# Patient Record
Sex: Female | Born: 1975 | Race: Black or African American | Hispanic: No | Marital: Married | Smoking: Current every day smoker
Health system: Southern US, Community
[De-identification: ages and names within clinical notes are randomized; demographics above are authoritative.]

## PROBLEM LIST (undated history)

## (undated) DIAGNOSIS — N83209 Unspecified ovarian cyst, unspecified side: Secondary | ICD-10-CM

## (undated) DIAGNOSIS — G039 Meningitis, unspecified: Secondary | ICD-10-CM

## (undated) DIAGNOSIS — G43909 Migraine, unspecified, not intractable, without status migrainosus: Secondary | ICD-10-CM

## (undated) HISTORY — PX: ABLATION: SHX5711

## (undated) HISTORY — PX: TUBAL LIGATION: SHX77

---

## 2014-09-06 ENCOUNTER — Encounter (HOSPITAL_COMMUNITY): Payer: Self-pay | Admitting: Emergency Medicine

## 2014-09-06 ENCOUNTER — Emergency Department (HOSPITAL_COMMUNITY)
Admission: EM | Admit: 2014-09-06 | Discharge: 2014-09-07 | Disposition: A | Discharge status: Home / Self Care | Payer: Medicaid - Out of State | Attending: Emergency Medicine | Admitting: Emergency Medicine

## 2014-09-06 DIAGNOSIS — G43909 Migraine, unspecified, not intractable, without status migrainosus: Secondary | ICD-10-CM | POA: Insufficient documentation

## 2014-09-06 DIAGNOSIS — Z8661 Personal history of infections of the central nervous system: Secondary | ICD-10-CM | POA: Insufficient documentation

## 2014-09-06 DIAGNOSIS — R42 Dizziness and giddiness: Secondary | ICD-10-CM | POA: Insufficient documentation

## 2014-09-06 DIAGNOSIS — M542 Cervicalgia: Secondary | ICD-10-CM | POA: Insufficient documentation

## 2014-09-06 DIAGNOSIS — G971 Other reaction to spinal and lumbar puncture: Secondary | ICD-10-CM | POA: Insufficient documentation

## 2014-09-06 DIAGNOSIS — Z720 Tobacco use: Secondary | ICD-10-CM | POA: Insufficient documentation

## 2014-09-06 DIAGNOSIS — G43009 Migraine without aura, not intractable, without status migrainosus: Secondary | ICD-10-CM

## 2014-09-06 HISTORY — DX: Migraine, unspecified, not intractable, without status migrainosus: G43.909

## 2014-09-06 HISTORY — DX: Meningitis, unspecified: G03.9

## 2014-09-06 NOTE — ED Notes (Signed)
Patient complaining of migraine. Reports has had a headache since Thursday. 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 Recently seen at [facility] for same, and she reports they performed a spinal tap on her at approximately 0530 this morning.

## 2014-09-07 MED ORDER — MAGNESIUM SULFATE 2 GM/50ML IV SOLN
2.0000 g | Freq: Once | INTRAVENOUS | Status: AC
  Administered 2014-09-07: 2 g via INTRAVENOUS
  Filled 2014-09-07: qty 50

## 2014-09-07 MED ORDER — DIPHENHYDRAMINE HCL 50 MG/ML IJ SOLN
25.0000 mg | Freq: Once | INTRAMUSCULAR | Status: AC
  Administered 2014-09-07: 25 mg via INTRAVENOUS
  Filled 2014-09-07: qty 1

## 2014-09-07 MED ORDER — METOCLOPRAMIDE HCL 5 MG/ML IJ SOLN
10.0000 mg | Freq: Once | INTRAMUSCULAR | Status: AC
  Administered 2014-09-07: 10 mg via INTRAVENOUS
  Filled 2014-09-07: qty 2

## 2014-09-07 MED ORDER — SODIUM CHLORIDE 0.9 % IV SOLN
1000.0000 mL | INTRAVENOUS | Status: DC
  Administered 2014-09-07: 1000 mL via INTRAVENOUS

## 2014-09-07 MED ORDER — SODIUM CHLORIDE 0.9 % IV SOLN
1000.0000 mL | Freq: Once | INTRAVENOUS | Status: AC
  Administered 2014-09-07: 1000 mL via INTRAVENOUS

## 2014-09-07 MED ORDER — SODIUM CHLORIDE 0.9 % IV SOLN
1000.0000 mL | Freq: Once | INTRAVENOUS | Status: AC
Start: 2014-09-07 — End: 2014-09-07
  Administered 2014-09-07: 1000 mL via INTRAVENOUS

## 2014-09-07 MED ORDER — NAPROXEN 500 MG PO TABS: ORAL_TABLET | ORAL | Status: DC

## 2014-09-07 MED ORDER — KETOROLAC TROMETHAMINE 30 MG/ML IJ SOLN
30.0000 mg | Freq: Once | INTRAMUSCULAR | Status: AC
  Administered 2014-09-07: 30 mg via INTRAVENOUS
  Filled 2014-09-07: qty 1

## 2014-09-07 MED ORDER — CYCLOBENZAPRINE HCL 10 MG PO TABS: 10.0000 mg | ORAL_TABLET | Freq: Three times a day (TID) | ORAL | Status: DC | PRN

## 2014-09-07 NOTE — Discharge Instructions (Signed)
Go home and rest. Take the medication if needed for pain. Use ice pack on your forehead which can help with the headaches. Follow up with Dr Maryln Gottron as discussed. Recheck if you get a fever or seem worse.

## 2014-09-07 NOTE — ED Provider Notes (Signed)
CSN: [id number]     Arrival date & time 09/06/14  2317 History  This chart was scribed for Devoria Albe, MD by Tanda Rockers, ED Scribe. This patient was seen in room APA01/APA01 and the patient's care was started at 12:06 AM.    Chief Complaint  Patient presents with  . Migraine   The history is provided by the patient. No language interpreter was used.     HPI Comments: Lori Monroe is a 39 y.o. female with hx migraines who presents to the Emergency Department complaining of gradual onset, severe, diffuse headache starting July 20. She describes it as aching and throbbing in sensation. The pain is worsened with light and sound exposure. The headache is mildly alleviated with applying pressure to pressure point in the web space of her thumb and index finger.  Pt also complains of low grade fever, nausea, vomiting, blurry vision, posterior neck pain, and presyncopal dizziness. Pt reports similar symptoms in the past when she had viral meningitis in 2014 (approximately 2 years ago). Pt has had meningitis approximately 5 times. Pt was seen at [facility] this morning and had a lumbar puncture done; she cannot say what the exact findings were. She mentions that her headache has worsened since being seen in [place]. She was referred to a neurologist but has not seen him yet. She mentions previous migraine headaches in the past but states that this headache is much worse. She states that the headache are only very severe when she has had meningitis. Denies numbness or tingling in extremities or any other associated symptoms.  PCP Dr Brent Bulla in [place], [place] Neurologist Dr Maryln Gottron in [place], [place]   Past Medical History  Diagnosis Date  . Meningitis   . Migraines    Past Surgical History  Procedure Laterality Date  . Ablation    . Tubal ligation     History reviewed. No pertinent family history. History  Substance Use Topics  . Smoking status: Current Every Day Smoker  . Smokeless  tobacco: Not on file  . Alcohol Use: No   OB History    No data available     Review of Systems  Constitutional: Positive for fever.  Eyes: Positive for visual disturbance.  Gastrointestinal: Positive for nausea and vomiting.  Musculoskeletal: Positive for neck pain.  Neurological: Positive for dizziness and headaches. Negative for numbness.  All other systems reviewed and are negative.  Allergies  Tramadol  Home Medications   Prior to Admission medications   Medication Sig Start Date End Date Taking? Authorizing Provider  cyclobenzaprine (FLEXERIL) 10 MG tablet Take 1 tablet (10 mg total) by mouth 3 (three) times daily as needed (muscle soreness). 09/07/14   Devoria Albe, MD  naproxen (NAPROSYN) 500 MG tablet Take 1 po BID with food prn pain 09/07/14   Devoria Albe, MD   Triage Vitals: BP 152/104 mmHg  Pulse 105  Temp(Src) 99.2 F (37.3 C) (Oral)  Resp 20  Ht 5\' 4"  (1.626 m)  Wt 141 lb (63.957 kg)  BMI 24.19 kg/m2  SpO2 100%  LMP    Physical Exam  Constitutional: She is oriented to person, place, and time. She appears well-developed and well-nourished.  Non-toxic appearance. She does not appear ill. She appears distressed.  Appears distressed.  Has eyes covered.   HENT:  Head: Normocephalic and atraumatic.  Right Ear: External ear normal.  Left Ear: External ear normal.  Nose: Nose normal. No mucosal edema or rhinorrhea.  Mouth/Throat: Oropharynx is clear and moist  and mucous membranes are normal. No dental abscesses or uvula swelling.  Eyes: Conjunctivae and EOM are normal. Pupils are equal, round, and reactive to light.  Neck: Normal range of motion and full passive range of motion without pain. Neck supple.  Moves head freely with normal conversation   Cardiovascular: Normal rate, regular rhythm and normal heart sounds.  Exam reveals no gallop and no friction rub.   No murmur heard. Pulmonary/Chest: Effort normal and breath sounds normal. No respiratory distress. She  has no wheezes. She has no rhonchi. She has no rales. She exhibits no tenderness and no crepitus.  Abdominal: Soft. Normal appearance and bowel sounds are normal. She exhibits no distension. There is no tenderness. There is no rebound and no guarding.  Musculoskeletal: Normal range of motion. She exhibits no edema or tenderness.  Moves all extremities well.   Neurological: She is alert and oriented to person, place, and time. She has normal strength. No cranial nerve deficit.  Skin: Skin is warm, dry and intact. No rash noted. No erythema. No pallor.  Psychiatric: She has a normal mood and affect. Her speech is normal and behavior is normal. Her mood appears not anxious.  Nursing note and vitals reviewed.   ED Course  Procedures (including critical care time)  Medications  0.9 %  sodium chloride infusion (0 mLs Intravenous Stopped 09/07/14 0238)    Followed by  0.9 %  sodium chloride infusion (0 mLs Intravenous Stopped 09/07/14 0256)    Followed by  0.9 %  sodium chloride infusion (0 mLs Intravenous Stopped 09/07/14 0531)  metoCLOPramide (REGLAN) injection 10 mg (10 mg Intravenous Given 09/07/14 0031)  diphenhydrAMINE (BENADRYL) injection 25 mg (25 mg Intravenous Given 09/07/14 0031)  ketorolac (TORADOL) 30 MG/ML injection 30 mg (30 mg Intravenous Given 09/07/14 0031)  magnesium sulfate IVPB 2 g 50 mL (0 g Intravenous Stopped 09/07/14 0515)     DIAGNOSTIC STUDIES: Oxygen Saturation is 100% on RA, normal by my interpretation.    COORDINATION OF CARE: 12:25 AM-Discussed treatment plan which includes reglan, benadryl, and toradol with pt at bedside and pt agreed to plan.  I discussed with the patient that she may have a post LP headache. She was given 2 L of IV fluid in addition to the migraine cocktail.   Recheck at 4 10 AM patient had been sleeping. She said the medication had made her headache almost go away however it was starting to return. She was given IV magnesium. We discussed her test  results from [facility]. She was reassured that I reviewed her tests and that they  were normal.   at time of discharge patient was headache free and felt much improved.   I received patient ED visits from [facility]. She was seen in the early morning on July 23. She had a LP performed. Those results showed Gram stain no WBCs seen , no organisms seen. CSF  Total protein was 28 with normal range 15-45, CSF glucose was 56 with normal 40-70 , CSF was colorless and clear with 0 WBCs and 6 RBCs. She also had a head CT that was normal. She was given IV Solu-Medrol, Zofran, hydromorphone 2 mg twice, Benadryl 25 mg IV, and orphenadrine 60 mg IV. She was improved at discharge. She returned later that evening for headache returning and was worse. She had ketorolac, Benadryl , Compazine , loratadine 10 mg by mouth, and Augmentin ordered but refused by the patient. She  eloped from the ER and  did not finish her evaluation.    Labs Review Labs Reviewed - No data to display  Imaging Review No results found.   EKG Interpretation None      MDM   Final diagnoses:  Migraine without aura and without status migrainosus, not intractable  Post lumbar puncture headache    New Prescriptions   CYCLOBENZAPRINE (FLEXERIL) 10 MG TABLET    Take 1 tablet (10 mg total) by mouth 3 (three) times daily as needed (muscle soreness).   NAPROXEN (NAPROSYN) 500 MG TABLET    Take 1 po BID with food prn pain    Plan discharge  Devoria Albe, MD, FACEP   I personally performed the services described in this documentation, which was scribed in my presence. The recorded information has been reviewed and considered.  Devoria Albe, MD, Concha Pyo, MD 09/07/14 [phone]

## 2015-10-31 ENCOUNTER — Emergency Department (HOSPITAL_COMMUNITY)
Admission: EM | Admit: 2015-10-31 | Discharge: 2015-10-31 | Disposition: A | Discharge status: Home / Self Care | Payer: Medicaid - Out of State | Attending: Emergency Medicine | Admitting: Emergency Medicine

## 2015-10-31 ENCOUNTER — Encounter (HOSPITAL_COMMUNITY): Payer: Self-pay | Admitting: Emergency Medicine

## 2015-10-31 DIAGNOSIS — Z79899 Other long term (current) drug therapy: Secondary | ICD-10-CM | POA: Insufficient documentation

## 2015-10-31 DIAGNOSIS — F17210 Nicotine dependence, cigarettes, uncomplicated: Secondary | ICD-10-CM | POA: Diagnosis not present

## 2015-10-31 DIAGNOSIS — K0889 Other specified disorders of teeth and supporting structures: Secondary | ICD-10-CM | POA: Diagnosis present

## 2015-10-31 DIAGNOSIS — K029 Dental caries, unspecified: Secondary | ICD-10-CM | POA: Diagnosis not present

## 2015-10-31 MED ORDER — HYDROCODONE-ACETAMINOPHEN 5-325 MG PO TABS: 1.0000 | ORAL_TABLET | ORAL | 0 refills | Status: DC | PRN

## 2015-10-31 MED ORDER — HYDROCODONE-ACETAMINOPHEN 5-325 MG PO TABS
1.0000 | ORAL_TABLET | Freq: Once | ORAL | Status: AC
  Administered 2015-10-31: 1 via ORAL
  Filled 2015-10-31: qty 1

## 2015-10-31 NOTE — ED Triage Notes (Signed)
PT c/o right lower dental pain x2 weeks.

## 2015-10-31 NOTE — ED Provider Notes (Signed)
[facility]-EMERG[facility]Y DEPT Provider Note   CSN: [id number] Arrival date & time: 10/31/15  1346     History   Chief Complaint Chief Complaint  Patient presents with  . Dental Pain    HPI Lori Monroe is a 40 y.o. female presenting with a 2 week history of dental pain and gingival swelling.   The patient has a history  decay in the tooth involved for some time and has had a recent fracture of the tooth worsening pain.  There has been no fevers, chills, nausea or vomiting, also no complaint of difficulty swallowing, although chewing makes pain worse.  she endorses hot and cold sensitivity.  She has seen her dentist who initially placed her on amoxil, but was switched to clindamycin which she is currently taking.  She is tentatively scheduled for extraction in 4 days but is still waiting for her insurance to approve.  The patient has tried naproxen without relief of symptoms.    .  The history is provided by the patient.  Dental Pain      Past Medical History:  Diagnosis Date  . Meningitis   . Migraines     There are no active problems to display for this patient.   Past Surgical History:  Procedure Laterality Date  . ABLATION    . TUBAL LIGATION      OB History    Gravida Para Term Preterm AB Living             3   SAB TAB Ectopic Multiple Live Births                   Home Medications    Prior to Admission medications   Medication Sig Start Date End Date Taking? Authorizing Provider  cyclobenzaprine (FLEXERIL) 10 MG tablet Take 1 tablet (10 mg total) by mouth 3 (three) times daily as needed (muscle soreness). 09/07/14   Devoria AlbeIva Knapp, MD  naproxen (NAPROSYN) 500 MG tablet Take 1 po BID with food prn pain 09/07/14   Devoria AlbeIva Knapp, MD    Family History History reviewed. No pertinent family history.  Social History Social History  Substance Use Topics  . Smoking status: Current Every Day Smoker    Packs/day: 0.50    Types: Cigarettes  . Smokeless tobacco: Never Used  .  Alcohol use No     Allergies   Tramadol   Review of Systems Review of Systems  Constitutional: Negative for fever.  HENT: Positive for dental problem. Negative for facial swelling and sore throat.   Respiratory: Negative for shortness of breath.   Musculoskeletal: Negative for neck pain and neck stiffness.     Physical Exam Updated Vital Signs BP 143/97 (BP Location: Left Arm)   Pulse 94   Temp 98.8 F (37.1 C) (Oral)   Resp 20   Ht 5\' 4"  (1.626 m)   Wt 80.7 kg   LMP 10/29/2014 Comment: ablation  SpO2 100%   BMI 30.55 kg/m   Physical Exam  Constitutional: She is oriented to person, place, and time. She appears well-developed and well-nourished. No distress.  HENT:  Head: Normocephalic and atraumatic.  Right Ear: Tympanic membrane and external ear normal.  Left Ear: Tympanic membrane and external ear normal.  Mouth/Throat: Uvula is midline, oropharynx is clear and moist and mucous membranes are normal. No oral lesions. No trismus in the jaw. Dental caries present. No dental abscesses.  Right lower 1st molar tooth with deep cavity on anterior occlusive surface.  No  abscess. Mild gingival edema.   Eyes: Conjunctivae are normal.  Neck: Normal range of motion. Neck supple.  Cardiovascular: Normal rate and normal heart sounds.   Pulmonary/Chest: Effort normal.  Musculoskeletal: Normal range of motion.  Lymphadenopathy:    She has no cervical adenopathy.  Neurological: She is alert and oriented to person, place, and time.  Skin: Skin is warm and dry. No erythema.  Psychiatric: She has a normal mood and affect.     ED Treatments / Results  Labs (all labs ordered are listed, but only abnormal results are displayed) Labs Reviewed - No data to display  EKG  EKG Interpretation None       Radiology No results found.  Procedures Procedures (including critical care time)  Medications Ordered in ED Medications - No data to display   Initial Impression /  Assessment and Plan / ED Course  I have reviewed the triage vital signs and the nursing notes.  Pertinent labs & imaging results that were available during my care of the patient were reviewed by me and considered in my medical decision making (see chart for details).  Clinical Course    [place] controlled substance database reviewed. Pt prescribed hydrocodone. Advised to continue taking clindamycin.  F/u with dentist as planned.    Final Clinical Impressions(s) / ED Diagnoses   Final diagnoses:  None    New Prescriptions Discharge Medication List as of 10/31/2015  3:11 PM    START taking these medications   Details  HYDROcodone-acetaminophen (NORCO/VICODIN) 5-325 MG tablet Take 1 tablet by mouth every 4 (four) hours as needed., Starting Sat 10/31/2015, Print         Burgess Amor, PA-C 10/31/15 2108    Vanetta Mulders, MD 11/01/15 2125

## 2016-03-18 ENCOUNTER — Encounter (HOSPITAL_COMMUNITY): Payer: Self-pay | Admitting: *Deleted

## 2016-03-18 ENCOUNTER — Emergency Department (HOSPITAL_COMMUNITY)
Admission: EM | Admit: 2016-03-18 | Discharge: 2016-03-19 | Disposition: A | Discharge status: Home / Self Care | Payer: Medicaid - Out of State | Attending: Emergency Medicine | Admitting: Emergency Medicine

## 2016-03-18 DIAGNOSIS — F17210 Nicotine dependence, cigarettes, uncomplicated: Secondary | ICD-10-CM | POA: Insufficient documentation

## 2016-03-18 DIAGNOSIS — R112 Nausea with vomiting, unspecified: Secondary | ICD-10-CM | POA: Diagnosis present

## 2016-03-18 DIAGNOSIS — R1084 Generalized abdominal pain: Secondary | ICD-10-CM | POA: Diagnosis not present

## 2016-03-18 LAB — COMPREHENSIVE METABOLIC PANEL
ALT: 18 U/L (ref 14–54)
AST: 18 U/L (ref 15–41)
Albumin: 3.8 g/dL (ref 3.5–5.0)
Alkaline Phosphatase: 58 U/L (ref 38–126)
Anion gap: 8 (ref 5–15)
BUN: 9 mg/dL (ref 6–20)
CHLORIDE: 102 mmol/L (ref 101–111)
CO2: 28 mmol/L (ref 22–32)
Calcium: 9.5 mg/dL (ref 8.9–10.3)
Creatinine, Ser: 0.78 mg/dL (ref 0.44–1.00)
GFR calc Af Amer: >60 mL/min (ref >60)
Glucose, Bld: 93 mg/dL (ref 65–99)
Potassium: 3.6 mmol/L (ref 3.5–5.1)
SODIUM: 138 mmol/L (ref 135–145)
Total Bilirubin: 0.8 mg/dL (ref 0.3–1.2)
Total Protein: 7.1 g/dL (ref 6.5–8.1)

## 2016-03-18 LAB — CBC WITH DIFFERENTIAL/PLATELET
Basophils Absolute: 0 10*3/uL (ref 0.0–0.1)
Basophils Relative: 0 %
Eosinophils Absolute: 0 10*3/uL (ref 0.0–0.7)
Eosinophils Relative: 0 %
HCT: 42.2 % (ref 36.0–46.0)
Hemoglobin: 14.1 g/dL (ref 12.0–15.0)
Lymphocytes Relative: 19 %
Lymphs Abs: 2.5 10*3/uL (ref 0.7–4.0)
MCH: 30.1 pg (ref 26.0–34.0)
MCHC: 33.4 g/dL (ref 30.0–36.0)
MCV: 90 fL (ref 78.0–100.0)
MONOS PCT: 7 %
Monocytes Absolute: 0.9 10*3/uL (ref 0.1–1.0)
Neutro Abs: 9.9 10*3/uL — ABNORMAL HIGH (ref 1.7–7.7)
Neutrophils Relative %: 74 %
Platelets: 310 10*3/uL (ref 150–400)
RBC: 4.69 MIL/uL (ref 3.87–5.11)
RDW: 14.9 % (ref 11.5–15.5)
WBC: 13.4 10*3/uL — ABNORMAL HIGH (ref 4.0–10.5)

## 2016-03-18 LAB — I-STAT BETA HCG BLOOD, ED (MC, WL, AP ONLY): I-stat hCG, quantitative: <5 m[IU]/mL (ref <5)

## 2016-03-18 LAB — LIPASE, BLOOD: LIPASE: 17 U/L (ref 11–51)

## 2016-03-18 MED ORDER — METOCLOPRAMIDE HCL 5 MG/ML IJ SOLN
10.0000 mg | Freq: Once | INTRAMUSCULAR | Status: AC
  Administered 2016-03-18: 10 mg via INTRAVENOUS
  Filled 2016-03-18: qty 2

## 2016-03-18 MED ORDER — SODIUM CHLORIDE 0.9 % IV BOLUS (SEPSIS)
2000.0000 mL | Freq: Once | INTRAVENOUS | Status: AC
  Administered 2016-03-18: 2000 mL via INTRAVENOUS

## 2016-03-18 MED ORDER — SODIUM CHLORIDE 0.9 % IV BOLUS (SEPSIS)
1000.0000 mL | Freq: Once | INTRAVENOUS | Status: AC
  Administered 2016-03-19: 1000 mL via INTRAVENOUS

## 2016-03-18 NOTE — ED Provider Notes (Signed)
[facility]-EMER[facility]ENCY DEPT Provider Note   CSN: [id number] Arrival date & time: 03/18/16  2008     History   Chief Complaint Chief Complaint  Patient presents with  . Emesis    HPI Lori Monroe is a 41 y.o. female.  HPI Patient presents with 3 weeks of diffuse abdominal pain and vomiting. States she's been unable to eat or drink. States she feels dehydrated. She also has had increased lethargy and sleepiness. She's having irregular bowel movements. Denies any blood in the vomit or in the stool. No fever or chills. No sick contacts with similar symptoms. Was seen for similar symptoms in the emergency department and given Zofran. States this is not helping her nausea. Patient states she has had an upper endoscopy and colonoscopy last year that was normal. She is not regularly followed by a gastroenterologist. Past Medical History:  Diagnosis Date  . Meningitis   . Migraines     There are no active problems to display for this patient.   Past Surgical History:  Procedure Laterality Date  . ABLATION    . TUBAL LIGATION      OB History    Gravida Para Term Preterm AB Living             3   SAB TAB Ectopic Multiple Live Births                   Home Medications    Prior to Admission medications   Medication Sig Start Date End Date Taking? Authorizing Provider  ondansetron (ZOFRAN-ODT) 4 MG disintegrating tablet Take 4 mg by mouth every 8 (eight) hours as needed for nausea or vomiting.  03/08/16  Yes Historical Provider, MD  PROAIR HFA 108 (90 Base) MCG/ACT inhaler Inhale 1-2 puffs into the lungs every 6 (six) hours as needed for wheezing or shortness of breath.  01/27/16   Historical Provider, MD    Family History History reviewed. No pertinent family history.  Social History Social History  Substance Use Topics  . Smoking status: Current Every Day Smoker    Packs/day: 0.50    Types: Cigarettes  . Smokeless tobacco: Never Used  . Alcohol use No     Allergies     Tramadol   Review of Systems Review of Systems  Constitutional: Positive for fatigue. Negative for chills, diaphoresis and fever.  Respiratory: Negative for cough and shortness of breath.   Cardiovascular: Negative for chest pain, palpitations and leg swelling.  Gastrointestinal: Positive for abdominal pain, nausea and vomiting. Negative for abdominal distention, blood in stool, constipation and diarrhea.  Genitourinary: Negative for dysuria, flank pain, frequency, vaginal bleeding and vaginal discharge.  Musculoskeletal: Negative for arthralgias, myalgias and neck stiffness.  Skin: Negative for rash and wound.  Neurological: Positive for weakness (generalized). Negative for dizziness, syncope, light-headedness and headaches.  Psychiatric/Behavioral: Positive for sleep disturbance.  All other systems reviewed and are negative.    Physical Exam Updated Vital Signs BP 106/73 (BP Location: Left Arm)   Pulse 80   Temp 97.8 F (36.6 C) (Oral)   Resp 18   Ht 5\' 4"  (1.626 m)   Wt 182 lb (82.6 kg)   SpO2 100%   BMI 31.24 kg/m   Physical Exam  Constitutional: She is oriented to person, place, and time. She appears well-developed and well-nourished.  Patient is sleeping when I enter the room. Easily aroused.  HENT:  Head: Normocephalic and atraumatic.  Mouth/Throat: Oropharynx is clear and moist. No oropharyngeal exudate.  Eyes: EOM are normal. Pupils are equal, round, and reactive to light.  Neck: Normal range of motion. Neck supple.  Cardiovascular: Regular rhythm.  Exam reveals no gallop and no friction rub.   No murmur heard. Tachycardia  Pulmonary/Chest: Effort normal and breath sounds normal. No respiratory distress. She has no wheezes. She has no rales. She exhibits no tenderness.  Abdominal: Soft. Bowel sounds are normal. She exhibits no distension and no mass. There is no tenderness. There is no rebound and no guarding. No hernia.  Musculoskeletal: Normal range of motion.  She exhibits no edema or tenderness.  No CVA tenderness bilaterally.  Neurological: She is oriented to person, place, and time.  Moving all extremities without deficit. Sensation fully intact. Drowsy but easily aroused  Skin: Skin is warm and dry. Capillary refill takes less than 2 seconds. No rash noted. No erythema.  Psychiatric: She has a normal mood and affect. Her behavior is normal.  Nursing note and vitals reviewed.    ED Treatments / Results  Labs (all labs ordered are listed, but only abnormal results are displayed) Labs Reviewed  CBC WITH DIFFERENTIAL/PLATELET - Abnormal; Notable for the following:       Result Value   WBC 13.4 (*)    Neutro Abs 9.9 (*)    All other components within normal limits  COMPREHENSIVE METABOLIC PANEL  LIPASE, BLOOD  URINALYSIS, ROUTINE W REFLEX MICROSCOPIC  I-STAT BETA HCG BLOOD, ED ([facility], [facility], [facility] ONLY)    EKG  EKG Interpretation None       Radiology No results found.  Procedures Procedures (including critical care time)  Medications Ordered in ED Medications  sodium chloride 0.9 % bolus 1,000 mL (not administered)  sodium chloride 0.9 % bolus 2,000 mL (0 mLs Intravenous Stopped 03/19/16 0012)  metoCLOPramide (REGLAN) injection 10 mg (10 mg Intravenous Given 03/18/16 2147)     Initial Impression / Assessment and Plan / ED Course  I have reviewed the triage vital signs and the nursing notes.  Pertinent labs & imaging results that were available during my care of the patient were reviewed by me and considered in my medical decision making (see chart for details).   Benign abdominal exam. Tachycardia has resolved after IV fluids. No further vomiting in the emergency department. UA pending. We'll give PO trial. We'll need to follow-up with gastroenterology. Signed out to oncoming emergency physician pending reevaluation.    Final Clinical Impressions(s) / ED Diagnoses   Final diagnoses:  None    New Prescriptions New  Prescriptions   No medications on file     Loren Racer, MD 03/19/16 1610

## 2016-03-18 NOTE — ED Triage Notes (Addendum)
Pt reports emesis x 3 weeks. Pt states she was seen 3 weeks ago in the ED and was told she had a virus. Pt states she was given Zofran without. Pt has not called her PCP to inform them. Pt denies fever and diarrhea. Pt stats she has generalized body aches and is weak.

## 2016-03-19 LAB — URINALYSIS, ROUTINE W REFLEX MICROSCOPIC
BACTERIA UA: NONE SEEN
Bilirubin Urine: NEGATIVE
GLUCOSE, UA: NEGATIVE mg/dL
Ketones, ur: 5 mg/dL — AB
Leukocytes, UA: NEGATIVE
Nitrite: NEGATIVE
Protein, ur: 100 mg/dL — AB
Specific Gravity, Urine: 1.024 (ref 1.005–1.030)
pH: 5 (ref 5.0–8.0)

## 2016-03-19 MED ORDER — METOCLOPRAMIDE HCL 10 MG PO TABS: 10.0000 mg | ORAL_TABLET | Freq: Four times a day (QID) | ORAL | 0 refills | Status: DC | PRN

## 2016-03-19 MED ORDER — ONDANSETRON HCL 4 MG/2ML IJ SOLN
4.0000 mg | Freq: Once | INTRAMUSCULAR | Status: AC
  Administered 2016-03-19: 4 mg via INTRAVENOUS
  Filled 2016-03-19: qty 2

## 2016-03-19 NOTE — ED Provider Notes (Signed)
Assumed care of patient from Dr. Ranae PalmsYelverton with urinalysis pending. Urinalysis has returned and is normal. Patient has been tolerating oral intake. Will discharge, continue symptomatic treatment.   Gilda Creasehristopher J Pollina, MD 03/19/16 Lyda [id number]

## 2016-04-05 ENCOUNTER — Emergency Department (HOSPITAL_COMMUNITY)
Admission: EM | Admit: 2016-04-05 | Discharge: 2016-04-06 | Disposition: A | Discharge status: Home / Self Care | Payer: Medicaid - Out of State | Attending: Emergency Medicine | Admitting: Emergency Medicine

## 2016-04-05 ENCOUNTER — Encounter (HOSPITAL_COMMUNITY): Payer: Self-pay

## 2016-04-05 DIAGNOSIS — F17210 Nicotine dependence, cigarettes, uncomplicated: Secondary | ICD-10-CM | POA: Diagnosis not present

## 2016-04-05 DIAGNOSIS — G43909 Migraine, unspecified, not intractable, without status migrainosus: Secondary | ICD-10-CM | POA: Diagnosis present

## 2016-04-05 DIAGNOSIS — G43009 Migraine without aura, not intractable, without status migrainosus: Secondary | ICD-10-CM | POA: Insufficient documentation

## 2016-04-05 DIAGNOSIS — Z79899 Other long term (current) drug therapy: Secondary | ICD-10-CM | POA: Insufficient documentation

## 2016-04-05 NOTE — ED Triage Notes (Signed)
Patient states that she woke up with a headache this morning.  Complaining of nausea.  States that she has a history of headaches and she checked her temperature at work and it was 100.  I took tylenol at work this morning and once this evening around 3 pm, without relief.  Patient had a TB test done for work yesterday.

## 2016-04-05 NOTE — ED Notes (Signed)
Pt reports she woke up this AM with a HA and N/. Light and sound sensitivity. Had a fever at home and has taken Tylenol with relief of fever, no relief of HA. Hx of migraine and HA.

## 2016-04-06 LAB — BASIC METABOLIC PANEL
Anion gap: 6 (ref 5–15)
BUN: 8 mg/dL (ref 6–20)
CALCIUM: 9.2 mg/dL (ref 8.9–10.3)
CO2: 27 mmol/L (ref 22–32)
Chloride: 104 mmol/L (ref 101–111)
Creatinine, Ser: 0.67 mg/dL (ref 0.44–1.00)
GFR calc non Af Amer: >60 mL/min (ref >60)
Glucose, Bld: 84 mg/dL (ref 65–99)
POTASSIUM: 3.9 mmol/L (ref 3.5–5.1)
Sodium: 137 mmol/L (ref 135–145)

## 2016-04-06 LAB — CBC WITH DIFFERENTIAL/PLATELET
BASOS ABS: 0 10*3/uL (ref 0.0–0.1)
Basophils Relative: 0 %
Eosinophils Absolute: 0.1 10*3/uL (ref 0.0–0.7)
Eosinophils Relative: 1 %
HCT: 39.3 % (ref 36.0–46.0)
HEMOGLOBIN: 12.7 g/dL (ref 12.0–15.0)
LYMPHS PCT: 28 %
Lymphs Abs: 3.7 10*3/uL (ref 0.7–4.0)
MCH: 28.8 pg (ref 26.0–34.0)
MCHC: 32.3 g/dL (ref 30.0–36.0)
MCV: 89.1 fL (ref 78.0–100.0)
MONO ABS: 0.7 10*3/uL (ref 0.1–1.0)
Monocytes Relative: 6 %
NEUTROS ABS: 8.5 10*3/uL — AB (ref 1.7–7.7)
NEUTROS PCT: 65 %
Platelets: 395 10*3/uL (ref 150–400)
RBC: 4.41 MIL/uL (ref 3.87–5.11)
RDW: 14.1 % (ref 11.5–15.5)
WBC: 13 10*3/uL — AB (ref 4.0–10.5)

## 2016-04-06 MED ORDER — PROCHLORPERAZINE EDISYLATE 5 MG/ML IJ SOLN
10.0000 mg | Freq: Once | INTRAMUSCULAR | Status: AC
  Administered 2016-04-06: 10 mg via INTRAVENOUS
  Filled 2016-04-06: qty 2

## 2016-04-06 MED ORDER — KETOROLAC TROMETHAMINE 30 MG/ML IJ SOLN
30.0000 mg | Freq: Once | INTRAMUSCULAR | Status: AC
Start: 2016-04-06 — End: 2016-04-06
  Administered 2016-04-06: 30 mg via INTRAVENOUS
  Filled 2016-04-06: qty 1

## 2016-04-06 MED ORDER — PROMETHAZINE HCL 25 MG RE SUPP: 25.0000 mg | Freq: Four times a day (QID) | RECTAL | 0 refills | Status: DC | PRN

## 2016-04-06 MED ORDER — DICLOFENAC SODIUM 75 MG PO TBEC: 75.0000 mg | DELAYED_RELEASE_TABLET | Freq: Two times a day (BID) | ORAL | 0 refills | Status: DC

## 2016-04-06 MED ORDER — DIPHENHYDRAMINE HCL 50 MG/ML IJ SOLN
25.0000 mg | Freq: Once | INTRAMUSCULAR | Status: AC
  Administered 2016-04-06: 25 mg via INTRAVENOUS
  Filled 2016-04-06: qty 1

## 2016-04-06 MED ORDER — CYCLOBENZAPRINE HCL 10 MG PO TABS: 10.0000 mg | ORAL_TABLET | Freq: Three times a day (TID) | ORAL | 0 refills | Status: DC

## 2016-04-06 NOTE — Discharge Instructions (Signed)
Your vital signs within normal limits. There no gross neurologic deficits appreciated on your examination tonight. Please use Flexeril and diclofenac or headache along with your amitriptyline. Please see your primary doctor for assistance with breakthrough headache pain. May use Phenergan suppositories for nausea if needed. Phenergan and Flexeril may cause drowsiness, please do not drink alcohol, drive a vehicle, operating machinery, or participate in activities requiring concentration when taking either these medications.

## 2016-04-06 NOTE — ED Provider Notes (Signed)
[facility]-EMERGENCY DEPT Provider Note   CSN: [id number] Arrival date & time: 04/05/16  1912     History   Chief Complaint Chief Complaint  Patient presents with  . Headache    HPI Lori Monroe is a 41 y.o. female.  Patient is a 41 year old female who presents to the emergency department with a complaint of headache.  The patient states that this headache started on yesterday. She states that she had a TB skin tests, she lay down and upon waking she had a headache that was mostly in the frontal portion of her head. Also caused her to have soreness and stiffness in her neck. She had one episode of vomiting, and 3 episodes of diarrhea from yesterday until today. She says that she checked her temperature today and was noted to have a temperature of 100 on. She's not had any recent injury or trauma to the head or neck area. She's not had any recent operations or procedures. She has not had any double vision, but states that time it seems as though her peripheral vision is affected. It is of note that the patient has a history of" migraine headaches". She was told by her physician in the [place] area that she would probably have headaches for the rest of her life. She is currently on amitriptyline as a prophylactic headache medication.      Past Medical History:  Diagnosis Date  . Meningitis   . Migraines     There are no active problems to display for this patient.   Past Surgical History:  Procedure Laterality Date  . ABLATION    . TUBAL LIGATION      OB History    Gravida Para Term Preterm AB Living             3   SAB TAB Ectopic Multiple Live Births                   Home Medications    Prior to Admission medications   Medication Sig Start Date End Date Taking? Authorizing Provider  amitriptyline (ELAVIL) 25 MG tablet Take 75 mg by mouth at bedtime.   Yes Historical Provider, MD  metoCLOPramide (REGLAN) 10 MG tablet Take 1 tablet (10 mg total) by mouth every 6 (six)  hours as needed for nausea or vomiting. 03/19/16  Yes Loren Raceravid Yelverton, MD  PROAIR HFA 108 [phone](90 Base) MCG/ACT inhaler Inhale 1-2 puffs into the lungs every 6 (six) hours as needed for wheezing or shortness of breath.  01/27/16  Yes Historical Provider, MD    Family History No family history on file.  Social History Social History  Substance Use Topics  . Smoking status: Current Every Day Smoker    Packs/day: 0.50    Types: Cigarettes  . Smokeless tobacco: Never Used  . Alcohol use No     Allergies   Tramadol   Review of Systems Review of Systems  Constitutional: Positive for fever.  Gastrointestinal: Positive for nausea.  Musculoskeletal: Positive for neck pain.  Neurological: Positive for headaches. Negative for tremors, facial asymmetry, weakness and numbness.     Physical Exam Updated Vital Signs BP 116/76   Pulse 97   Temp 98.1 F (36.7 C) (Oral)   Resp 20   Ht 5\' 4"  (1.626 m)   Wt 81.6 kg   SpO2 100%   BMI 30.90 kg/m   Physical Exam  Constitutional: She is oriented to person, place, and time. She appears well-developed and well-nourished.  Non-toxic appearance.  Eyes covered. Pt sleeping, but aroused by spouse.  HENT:  Head: Normocephalic.  Right Ear: Tympanic membrane and external ear normal.  Left Ear: Tympanic membrane and external ear normal.  Eyes: EOM and lids are normal. Pupils are equal, round, and reactive to light.  Neck: Normal range of motion. Neck supple. Carotid bruit is not present.  Cardiovascular: Normal rate, regular rhythm, normal heart sounds, intact distal pulses and normal pulses.   Pulmonary/Chest: Breath sounds normal. No respiratory distress.  Abdominal: Soft. Bowel sounds are normal. There is no tenderness. There is no guarding.  Musculoskeletal: Normal range of motion.  There is tightness and tenseness of the upper trapezius on the right and on the left.  There is no nuchal rigidity appreciated on examination.  Lymphadenopathy:         Head (right side): No submandibular adenopathy present.       Head (left side): No submandibular adenopathy present.    She has no cervical adenopathy.  Neurological: She is alert and oriented to person, place, and time. She has normal strength. No cranial nerve deficit or sensory deficit.  Skin: Skin is warm and dry.  Psychiatric: She has a normal mood and affect. Her speech is normal.  Nursing note and vitals reviewed.    ED Treatments / Results  Labs (all labs ordered are listed, but only abnormal results are displayed) Labs Reviewed - No data to display  EKG  EKG Interpretation None       Radiology No results found.  Procedures Procedures (including critical care time)  Medications Ordered in ED Medications  ketorolac (TORADOL) 30 MG/ML injection 30 mg (not administered)  prochlorperazine (COMPAZINE) injection 10 mg (not administered)  diphenhydrAMINE (BENADRYL) injection 25 mg (not administered)     Initial Impression / Assessment and Plan / ED Course  I have reviewed the triage vital signs and the nursing notes.  Pertinent labs & imaging results that were available during my care of the patient were reviewed by me and considered in my medical decision making (see chart for details).     **I have reviewed nursing notes, vital signs, and all appropriate lab and imaging results for this patient.*  Final Clinical Impressions(s) / ED Diagnoses  Patient states that she woke up this morning with headache that was mostly in the frontal portion of her head, but she felt tightness and at times some stiffness in her neck. She has not had fever or chills reported. Tonight's examination is negative for nuchal rigidity. It is of note that the patient recently had a lumbar tap on. And there were no evidence of acute meningitis at that time. There no gross neurologic deficits appreciated on tonight's examination.  Patient was treated with Toradol, Compazine,  Benadryl,.  Recheck. Patient is sleep and resting comfortably. No new neurologic deficits appreciated. Patient is able to ambulate with assistance. No nuchal rigidity appreciated. Patient will be treated with Flexeril, diclofenac, and promethazine for nausea. She will continue her amitriptyline. I've encouraged the patient to see her primary physician to make him aware of breakthrough headache pain. And also to assist in developing a plan to assist with these breakthrough headache problems. Patient will return to the emergency department if any emergent changes, problems, or concerns.    Final diagnoses:  Migraine without aura and without status migrainosus, not intractable    New Prescriptions New Prescriptions   No medications on file     Ivery Quale, PA-C 04/06/16 0131  Mancel Bale, MD 04/06/16 1246

## 2016-07-12 ENCOUNTER — Encounter (HOSPITAL_COMMUNITY): Payer: Self-pay | Admitting: Emergency Medicine

## 2016-07-12 ENCOUNTER — Emergency Department (HOSPITAL_COMMUNITY)
Admission: EM | Admit: 2016-07-12 | Discharge: 2016-07-13 | Disposition: A | Discharge status: Home / Self Care | Payer: Medicaid - Out of State | Attending: Emergency Medicine | Admitting: Emergency Medicine

## 2016-07-12 ENCOUNTER — Emergency Department (HOSPITAL_COMMUNITY): Payer: Medicaid - Out of State

## 2016-07-12 DIAGNOSIS — L02214 Cutaneous abscess of groin: Secondary | ICD-10-CM | POA: Insufficient documentation

## 2016-07-12 DIAGNOSIS — R109 Unspecified abdominal pain: Secondary | ICD-10-CM | POA: Diagnosis present

## 2016-07-12 DIAGNOSIS — K644 Residual hemorrhoidal skin tags: Secondary | ICD-10-CM | POA: Insufficient documentation

## 2016-07-12 DIAGNOSIS — B373 Candidiasis of vulva and vagina: Secondary | ICD-10-CM | POA: Insufficient documentation

## 2016-07-12 DIAGNOSIS — R1084 Generalized abdominal pain: Secondary | ICD-10-CM

## 2016-07-12 DIAGNOSIS — Z79899 Other long term (current) drug therapy: Secondary | ICD-10-CM | POA: Diagnosis not present

## 2016-07-12 DIAGNOSIS — F17210 Nicotine dependence, cigarettes, uncomplicated: Secondary | ICD-10-CM | POA: Diagnosis not present

## 2016-07-12 DIAGNOSIS — R112 Nausea with vomiting, unspecified: Secondary | ICD-10-CM | POA: Insufficient documentation

## 2016-07-12 DIAGNOSIS — B3731 Acute candidiasis of vulva and vagina: Secondary | ICD-10-CM

## 2016-07-12 HISTORY — DX: Unspecified ovarian cyst, unspecified side: N83.209

## 2016-07-12 LAB — PREGNANCY, URINE: Preg Test, Ur: NEGATIVE

## 2016-07-12 LAB — COMPREHENSIVE METABOLIC PANEL
ALBUMIN: 3.8 g/dL (ref 3.5–5.0)
ALK PHOS: 56 U/L (ref 38–126)
ALT: 15 U/L (ref 14–54)
ANION GAP: 7 (ref 5–15)
AST: 18 U/L (ref 15–41)
BILIRUBIN TOTAL: 0.3 mg/dL (ref 0.3–1.2)
BUN: 9 mg/dL (ref 6–20)
CO2: 24 mmol/L (ref 22–32)
Calcium: 9.3 mg/dL (ref 8.9–10.3)
Chloride: 107 mmol/L (ref 101–111)
Creatinine, Ser: 0.74 mg/dL (ref 0.44–1.00)
GFR calc Af Amer: >60 mL/min (ref >60)
GFR calc non Af Amer: >60 mL/min (ref >60)
GLUCOSE: 81 mg/dL (ref 65–99)
POTASSIUM: 3.4 mmol/L — AB (ref 3.5–5.1)
SODIUM: 138 mmol/L (ref 135–145)
TOTAL PROTEIN: 6.9 g/dL (ref 6.5–8.1)

## 2016-07-12 LAB — URINALYSIS, ROUTINE W REFLEX MICROSCOPIC
Bilirubin Urine: NEGATIVE
Glucose, UA: NEGATIVE mg/dL
Ketones, ur: NEGATIVE mg/dL
Leukocytes, UA: NEGATIVE
NITRITE: NEGATIVE
PROTEIN: 100 mg/dL — AB
SPECIFIC GRAVITY, URINE: 1.025 (ref 1.005–1.030)
pH: 5 (ref 5.0–8.0)

## 2016-07-12 LAB — CBC
HEMATOCRIT: 39.6 % (ref 36.0–46.0)
HEMOGLOBIN: 12.9 g/dL (ref 12.0–15.0)
MCH: 27.7 pg (ref 26.0–34.0)
MCHC: 32.6 g/dL (ref 30.0–36.0)
MCV: 85.2 fL (ref 78.0–100.0)
Platelets: 333 10*3/uL (ref 150–400)
RBC: 4.65 MIL/uL (ref 3.87–5.11)
RDW: 14 % (ref 11.5–15.5)
WBC: 11.8 10*3/uL — ABNORMAL HIGH (ref 4.0–10.5)

## 2016-07-12 LAB — LIPASE, BLOOD: Lipase: 58 U/L — ABNORMAL HIGH (ref 11–51)

## 2016-07-12 LAB — WET PREP, GENITAL
CLUE CELLS WET PREP: NONE SEEN
SPERM: NONE SEEN
TRICH WET PREP: NONE SEEN

## 2016-07-12 MED ORDER — ONDANSETRON 4 MG PO TBDP: 4.0000 mg | ORAL_TABLET | Freq: Three times a day (TID) | ORAL | 0 refills | Status: DC | PRN

## 2016-07-12 MED ORDER — ONDANSETRON HCL 4 MG/2ML IJ SOLN
4.0000 mg | Freq: Once | INTRAMUSCULAR | Status: AC
  Administered 2016-07-12: 4 mg via INTRAVENOUS
  Filled 2016-07-12: qty 2

## 2016-07-12 MED ORDER — SODIUM CHLORIDE 0.9 % IV BOLUS (SEPSIS)
1000.0000 mL | Freq: Once | INTRAVENOUS | Status: AC
  Administered 2016-07-12: 1000 mL via INTRAVENOUS

## 2016-07-12 MED ORDER — ONDANSETRON 4 MG PO TBDP
ORAL_TABLET | ORAL | Status: AC
  Administered 2016-07-12: 4 mg
  Filled 2016-07-12: qty 1

## 2016-07-12 MED ORDER — FLUCONAZOLE 100 MG PO TABS
150.0000 mg | ORAL_TABLET | Freq: Once | ORAL | Status: AC
  Administered 2016-07-12: 150 mg via ORAL
  Filled 2016-07-12: qty 2

## 2016-07-12 MED ORDER — CHLORHEXIDINE GLUCONATE 4 % EX SOLN: 5.0000 mL | Freq: Every day | CUTANEOUS | 0 refills | Status: DC

## 2016-07-12 MED ORDER — MORPHINE SULFATE (PF) 4 MG/ML IV SOLN
4.0000 mg | Freq: Once | INTRAVENOUS | Status: AC
  Administered 2016-07-12: 4 mg via INTRAVENOUS
  Filled 2016-07-12: qty 1

## 2016-07-12 MED ORDER — IOPAMIDOL (ISOVUE-300) INJECTION 61%
INTRAVENOUS | Status: AC
Start: 2016-07-12 — End: 2016-07-12
  Administered 2016-07-12: 100 mL
  Filled 2016-07-12: qty 100

## 2016-07-12 MED ORDER — HYDROCORTISONE ACETATE 25 MG RE SUPP: 25.0000 mg | Freq: Two times a day (BID) | RECTAL | 0 refills | Status: DC

## 2016-07-12 MED ORDER — ONDANSETRON 4 MG PO TBDP: 4.0000 mg | ORAL_TABLET | Freq: Once | ORAL | Status: DC | PRN

## 2016-07-12 MED ORDER — DOCUSATE SODIUM 100 MG PO CAPS: 100.0000 mg | ORAL_CAPSULE | Freq: Two times a day (BID) | ORAL | 0 refills | Status: DC

## 2016-07-12 NOTE — ED Provider Notes (Signed)
[facility]-EMERGENCY DEPT Provider Note   CSN: [id number] Arrival date & time: 07/12/16  1902     History   Chief Complaint Chief Complaint  Patient presents with  . Abdominal Pain  . Hemorrhoids  . Recurrent Skin Infections    HPI Jaquasha Carnevale is a 41 y.o. female.  Pt presents to the ED today with abdominal pain.  She also has n/v.  She has a hx of ovarian cysts and feels this is the same.  The pt also has boils in her pubic region and a hemorrhoid.   Pt has been to other hospitals for her n/v and abdominal pain, but no imaging has been done.      Past Medical History:  Diagnosis Date  . Meningitis   . Migraines   . Ovarian cyst     There are no active problems to display for this patient.   Past Surgical History:  Procedure Laterality Date  . ABLATION    . TUBAL LIGATION      OB History    Gravida Para Term Preterm AB Living             3   SAB TAB Ectopic Multiple Live Births                   Home Medications    Prior to Admission medications   Medication Sig Start Date End Date Taking? Authorizing Provider  CRANBERRY PO Take 1 tablet by mouth daily.   Yes [provider]  metoCLOPramide (REGLAN) 10 MG tablet Take 1 tablet (10 mg total) by mouth every 6 (six) hours as needed for nausea or vomiting. 03/19/16  Yes Loren Racer, MD  PROAIR HFA 108 [phone] Base) MCG/ACT inhaler Inhale 1-2 puffs into the lungs every 6 (six) hours as needed for wheezing or shortness of breath.  01/27/16  Yes [provider]  promethazine (PHENERGAN) 25 MG suppository Place 1 suppository (25 mg total) rectally every 6 (six) hours as needed for nausea or vomiting. 04/06/16  Yes Ivery Quale, PA-C  Chlorhexidine Gluconate 4 % SOLN Apply 5 mLs topically daily. 07/12/16   Jacalyn Lefevre, MD  docusate sodium (COLACE) 100 MG capsule Take 1 capsule (100 mg total) by mouth every 12 (twelve) hours. 07/12/16   Jacalyn Lefevre, MD  hydrocortisone (ANUSOL-HC) 25 MG suppository  Place 1 suppository (25 mg total) rectally 2 (two) times daily. 07/12/16   Jacalyn Lefevre, MD  ondansetron (ZOFRAN ODT) 4 MG disintegrating tablet Take 1 tablet (4 mg total) by mouth every 8 (eight) hours as needed. 07/12/16   Jacalyn Lefevre, MD    Family History History reviewed. No pertinent family history.  Social History Social History  Substance Use Topics  . Smoking status: Current Every Day Smoker    Packs/day: 0.50    Types: Cigarettes  . Smokeless tobacco: Never Used  . Alcohol use No     Allergies   Tramadol   Review of Systems Review of Systems  Gastrointestinal: Positive for abdominal pain, nausea and vomiting.       Hemorrhoid  Skin: Positive for rash.  All other systems reviewed and are negative.    Physical Exam Updated Vital Signs BP (!) 143/91 (BP Location: Left Arm)   Pulse 76   Temp 98.1 F (36.7 C) (Oral)   Resp 19   Ht 5\' 4"  (1.626 m)   Wt 79.4 kg (175 lb)   SpO2 100%   BMI 30.04 kg/m   Physical Exam  Constitutional:  She is oriented to person, place, and time. She appears well-developed and well-nourished.  HENT:  Head: Normocephalic and atraumatic.  Right Ear: External ear normal.  Left Ear: External ear normal.  Nose: Nose normal.  Mouth/Throat: Oropharynx is clear and moist.  Eyes: Conjunctivae and EOM are normal. Pupils are equal, round, and reactive to light.  Neck: Normal range of motion. Neck supple.  Cardiovascular: Normal rate, regular rhythm, normal heart sounds and intact distal pulses.   Pulmonary/Chest: Effort normal and breath sounds normal.  Abdominal: Soft. Bowel sounds are normal. There is generalized tenderness.  Genitourinary: Rectal exam shows external hemorrhoid. Vaginal discharge found.  Genitourinary Comments: No active boils.  Just healed areas.  Musculoskeletal: Normal range of motion.  Neurological: She is alert and oriented to person, place, and time.  Skin: Skin is warm.  Psychiatric: She has a normal mood  and affect. Her behavior is normal. Judgment and thought content normal.  Nursing note and vitals reviewed.    ED Treatments / Results  Labs (all labs ordered are listed, but only abnormal results are displayed) Labs Reviewed  WET PREP, GENITAL - Abnormal; Notable for the following:       Result Value   Yeast Wet Prep HPF POC PRESENT (*)    WBC, Wet Prep HPF POC MODERATE (*)    All other components within normal limits  LIPASE, BLOOD - Abnormal; Notable for the following:    Lipase 58 (*)    All other components within normal limits  COMPREHENSIVE METABOLIC PANEL - Abnormal; Notable for the following:    Potassium 3.4 (*)    All other components within normal limits  CBC - Abnormal; Notable for the following:    WBC 11.8 (*)    All other components within normal limits  URINALYSIS, ROUTINE W REFLEX MICROSCOPIC - Abnormal; Notable for the following:    APPearance HAZY (*)    Hgb urine dipstick SMALL (*)    Protein, ur 100 (*)    Bacteria, UA RARE (*)    Squamous Epithelial / LPF 0-5 (*)    All other components within normal limits  PREGNANCY, URINE  GC/CHLAMYDIA PROBE AMP ([facility]) NOT AT [facility]    EKG  EKG Interpretation None       Radiology No results found.  Procedures Procedures (including critical care time)  Medications Ordered in ED Medications  ondansetron (ZOFRAN-ODT) disintegrating tablet 4 mg (not administered)  iopamidol (ISOVUE-300) 61 % injection (not administered)  fluconazole (DIFLUCAN) tablet 150 mg (not administered)  morphine 4 MG/ML injection 4 mg (not administered)  ondansetron (ZOFRAN-ODT) 4 MG disintegrating tablet (4 mg  Given 07/12/16 1933)  sodium chloride 0.9 % bolus 1,000 mL (0 mLs Intravenous Stopped 07/12/16 2319)  morphine 4 MG/ML injection 4 mg (4 mg Intravenous Given 07/12/16 2233)  ondansetron (ZOFRAN) injection 4 mg (4 mg Intravenous Given 07/12/16 2233)     Initial Impression / Assessment and Plan / ED Course  I have  reviewed the triage vital signs and the nursing notes.  Pertinent labs & imaging results that were available during my care of the patient were reviewed by me and considered in my medical decision making (see chart for details).     Pt will be treated here for her vaginal candidiasis with diflucan.  She is encouraged to eat yogurt and take probiotics.  She will be given anusol for hemorrhoid.  She is given chlorhexidine for her skin infections.  No oral abx indicated now.  Ct  results pending at shift change.  Final Clinical Impressions(s) / ED Diagnoses   Final diagnoses:  External hemorrhoid  Generalized abdominal pain  Non-intractable vomiting with nausea, unspecified vomiting type  Cutaneous abscess of groin  Vaginal candidiasis    New Prescriptions New Prescriptions   CHLORHEXIDINE GLUCONATE 4 % SOLN    Apply 5 mLs topically daily.   DOCUSATE SODIUM (COLACE) 100 MG CAPSULE    Take 1 capsule (100 mg total) by mouth every 12 (twelve) hours.   HYDROCORTISONE (ANUSOL-HC) 25 MG SUPPOSITORY    Place 1 suppository (25 mg total) rectally 2 (two) times daily.   ONDANSETRON (ZOFRAN ODT) 4 MG DISINTEGRATING TABLET    Take 1 tablet (4 mg total) by mouth every 8 (eight) hours as needed.     Jacalyn LefevreHaviland, Zygmund Passero, MD 07/15/16 [phone]

## 2016-07-12 NOTE — ED Triage Notes (Signed)
Pt presents with lower abd pain which she suspects is related to ovarian cysts, states "they burst all the time"; pt also concerned with a large hemorroid; pt also here for recurrent boils to lower abd area also

## 2016-07-12 NOTE — ED Notes (Signed)
Patient transported to CT 

## 2016-07-12 NOTE — ED Notes (Signed)
Pt returned from CT °

## 2016-07-13 LAB — GC/CHLAMYDIA PROBE AMP (~~LOC~~) NOT AT ARMC
CHLAMYDIA, DNA PROBE: NEGATIVE
Neisseria Gonorrhea: NEGATIVE

## 2016-07-13 NOTE — ED Notes (Signed)
ED Provider at bedside. 

## 2016-07-13 NOTE — ED Provider Notes (Signed)
Patient care assumed from Dr. Particia NearingHaviland at shift change. Please see her note for further.  Briefly, patient presented with generalized abdominal pain as well as hemorrhoids. Plan at shift change was for CT scan and if this was unremarkable she can be discharged. CT scan returned unremarkable. At my evaluation patient is resting comfortably in the bed. She reports feeling better after her medications. She feels ready for discharge. We'll discharge at this time with plan as laid out by Dr. Particia NearingHaviland. I discussed return precautions with the patient. I advised the patient to follow-up with their primary care provider this week. I advised the patient to return to the emergency department with new or worsening symptoms or new concerns. The patient verbalized understanding and agreement with plan.    Ct Abdomen Pelvis W Contrast  Result Date: 07/12/2016 CLINICAL DATA:  Acute onset of lower abdominal pain and vomiting. Initial encounter. EXAM: CT ABDOMEN AND PELVIS WITH CONTRAST TECHNIQUE: Multidetector CT imaging of the abdomen and pelvis was performed using the standard protocol following bolus administration of intravenous contrast. CONTRAST:  100mL ISOVUE-300 IOPAMIDOL (ISOVUE-300) INJECTION 61% COMPARISON:  None. FINDINGS: Lower chest: The visualized lung bases are grossly clear. The visualized portions of the mediastinum are unremarkable. Hepatobiliary: The liver is unremarkable in appearance. The gallbladder is unremarkable in appearance. The common bile duct remains normal in caliber. Pancreas: The pancreas is within normal limits. Spleen: The spleen is unremarkable in appearance. Adrenals/Urinary Tract: The adrenal glands are unremarkable in appearance. The kidneys are within normal limits. There is no evidence of hydronephrosis. No renal or ureteral stones are identified. No perinephric stranding is seen. Stomach/Bowel: The stomach is unremarkable in appearance. The small bowel is within normal limits. The  appendix is normal in caliber, without evidence of appendicitis. The colon is unremarkable in appearance. Vascular/Lymphatic: Minimal calcification is noted at the distal abdominal aorta. The abdominal aorta is otherwise unremarkable. No retroperitoneal or pelvic sidewall lymphadenopathy is seen. The inferior vena cava is grossly unremarkable in appearance. Reproductive: The bladder is mildly distended and within normal limits. The uterus is grossly unremarkable in appearance. The ovaries are relatively symmetric. No suspicious adnexal masses are seen. Other: No additional soft tissue abnormalities are seen. Musculoskeletal: No acute osseous abnormalities are identified. The visualized musculature is unremarkable in appearance. IMPRESSION: Unremarkable contrast-enhanced CT of the abdomen and pelvis. Electronically Signed   By: Roanna RaiderJeffery  Chang M.D.   On: 07/12/2016 23:54    External hemorrhoid  Generalized abdominal pain  Non-intractable vomiting with nausea, unspecified vomiting type  Cutaneous abscess of groin  Vaginal candidiasis     Everlene FarrierDansie, Yoandri Congrove, PA-C 07/13/16 [address] [id number]    Haviland, Julie, MD 07/15/16 1205

## 2017-01-16 ENCOUNTER — Emergency Department (HOSPITAL_COMMUNITY)
Admission: EM | Admit: 2017-01-16 | Discharge: 2017-01-16 | Disposition: A | Discharge status: Home / Self Care | Payer: Medicaid - Out of State | Attending: Emergency Medicine | Admitting: Emergency Medicine

## 2017-01-16 ENCOUNTER — Other Ambulatory Visit: Payer: Self-pay

## 2017-01-16 ENCOUNTER — Encounter (HOSPITAL_COMMUNITY): Payer: Self-pay

## 2017-01-16 DIAGNOSIS — F17210 Nicotine dependence, cigarettes, uncomplicated: Secondary | ICD-10-CM | POA: Insufficient documentation

## 2017-01-16 DIAGNOSIS — Z79899 Other long term (current) drug therapy: Secondary | ICD-10-CM | POA: Insufficient documentation

## 2017-01-16 DIAGNOSIS — M436 Torticollis: Secondary | ICD-10-CM | POA: Diagnosis not present

## 2017-01-16 DIAGNOSIS — M542 Cervicalgia: Secondary | ICD-10-CM | POA: Diagnosis present

## 2017-01-16 MED ORDER — CYCLOBENZAPRINE HCL 10 MG PO TABS: 10.0000 mg | ORAL_TABLET | Freq: Three times a day (TID) | ORAL | 0 refills | Status: DC

## 2017-01-16 MED ORDER — DIAZEPAM 5 MG PO TABS
10.0000 mg | ORAL_TABLET | Freq: Once | ORAL | Status: AC
  Administered 2017-01-16: 10 mg via ORAL
  Filled 2017-01-16: qty 2

## 2017-01-16 MED ORDER — ONDANSETRON HCL 4 MG PO TABS
4.0000 mg | ORAL_TABLET | Freq: Once | ORAL | Status: AC
  Administered 2017-01-16: 4 mg via ORAL
  Filled 2017-01-16: qty 1

## 2017-01-16 MED ORDER — IBUPROFEN 600 MG PO TABS: 600.0000 mg | ORAL_TABLET | Freq: Four times a day (QID) | ORAL | 0 refills | Status: DC

## 2017-01-16 MED ORDER — HYDROCODONE-ACETAMINOPHEN 5-325 MG PO TABS
1.0000 | ORAL_TABLET | Freq: Once | ORAL | Status: AC
  Administered 2017-01-16: 1 via ORAL
  Filled 2017-01-16: qty 1

## 2017-01-16 MED ORDER — HYDROCODONE-ACETAMINOPHEN 5-325 MG PO TABS: 1.0000 | ORAL_TABLET | ORAL | 0 refills | Status: DC | PRN

## 2017-01-16 NOTE — ED Provider Notes (Signed)
[facility] EMERGENCY DEPARTMENT Provider Note   CSN: [id number] Arrival date & time: 01/16/17  4098     History   Chief Complaint Chief Complaint  Patient presents with  . Neck Pain    "awoke with crick in neck"    HPI Lori Monroe is a 41 y.o. female.  Patient is a 41 year old female who presents to the emergency department with complaint of neck pain.  The patient states that she awakened about a week ago feeling like she had a "crick in her neck".  She states the pain is still present and does not seem to be getting better.  She has tried Motrin, Aleve, and icy hot with no significant relief.  No recent injury to the neck.  No operations or procedures involving the neck.  No other problems reported at this time.  Patient denies dropping objects, or tingling in the arms.      Past Medical History:  Diagnosis Date  . Meningitis   . Migraines   . Ovarian cyst     There are no active problems to display for this patient.   Past Surgical History:  Procedure Laterality Date  . ABLATION    . TUBAL LIGATION      OB History    Gravida Para Term Preterm AB Living             3   SAB TAB Ectopic Multiple Live Births                   Home Medications    Prior to Admission medications   Medication Sig Start Date End Date Taking? Authorizing Provider  Chlorhexidine Gluconate 4 % SOLN Apply 5 mLs topically daily. 07/12/16   Jacalyn Lefevre, MD  CRANBERRY PO Take 1 tablet by mouth daily.    [provider]  docusate sodium (COLACE) 100 MG capsule Take 1 capsule (100 mg total) by mouth every 12 (twelve) hours. 07/12/16   Jacalyn Lefevre, MD  hydrocortisone (ANUSOL-HC) 25 MG suppository Place 1 suppository (25 mg total) rectally 2 (two) times daily. 07/12/16   Jacalyn Lefevre, MD  metoCLOPramide (REGLAN) 10 MG tablet Take 1 tablet (10 mg total) by mouth every 6 (six) hours as needed for nausea or vomiting. 03/19/16   Loren Racer, MD  ondansetron (ZOFRAN ODT)  4 MG disintegrating tablet Take 1 tablet (4 mg total) by mouth every 8 (eight) hours as needed. 07/12/16   Jacalyn Lefevre, MD  PROAIR HFA 108 [phone] Base) MCG/ACT inhaler Inhale 1-2 puffs into the lungs every 6 (six) hours as needed for wheezing or shortness of breath.  01/27/16   [provider]  promethazine (PHENERGAN) 25 MG suppository Place 1 suppository (25 mg total) rectally every 6 (six) hours as needed for nausea or vomiting. 04/06/16   Ivery Quale, PA-C    Family History No family history on file.  Social History Social History   Tobacco Use  . Smoking status: Current Every Day Smoker    Packs/day: 0.50    Types: Cigarettes  . Smokeless tobacco: Never Used  Substance Use Topics  . Alcohol use: No  . Drug use: No     Allergies   Tramadol   Review of Systems Review of Systems  Constitutional: Negative for activity change.       All ROS Neg except as noted in HPI  HENT: Negative for nosebleeds.   Eyes: Negative for photophobia and discharge.  Respiratory: Negative for cough, shortness of breath and  wheezing.   Cardiovascular: Negative for chest pain and palpitations.  Gastrointestinal: Negative for abdominal pain and blood in stool.  Genitourinary: Negative for dysuria, frequency and hematuria.  Musculoskeletal: Negative for arthralgias, back pain and neck pain.  Skin: Negative.   Neurological: Negative for dizziness, seizures and speech difficulty.  Psychiatric/Behavioral: Negative for confusion and hallucinations.     Physical Exam Updated Vital Signs BP (!) 145/95 (BP Location: Left Arm)   Pulse 95   Temp 98.8 F (37.1 C) (Oral)   Resp 16   Ht 5\' 4"  (1.626 m)   Wt 81.6 kg (180 lb)   SpO2 99%   BMI 30.90 kg/m   Physical Exam  Constitutional: She is oriented to person, place, and time. She appears well-developed and well-nourished.  Non-toxic appearance.  HENT:  Head: Normocephalic.  Right Ear: Tympanic membrane and external ear normal.    Left Ear: Tympanic membrane and external ear normal.  Eyes: EOM and lids are normal. Pupils are equal, round, and reactive to light.  Neck: Normal range of motion. Neck supple. Carotid bruit is not present.  Cardiovascular: Normal rate, regular rhythm, normal heart sounds, intact distal pulses and normal pulses.  Pulmonary/Chest: Breath sounds normal. No respiratory distress.  Abdominal: Soft. Bowel sounds are normal. There is no tenderness. There is no guarding.  Musculoskeletal: Normal range of motion.  There is tightness and tenseness of the upper trapezius on the right.  There is no deformity or dislocation involving the right shoulder.  There is pain of the shoulder extending toward the neck with attempted range of motion.  The right and left radial pulses are 2+.  Capillary refill is less than 2 seconds bilaterally.  Lymphadenopathy:       Head (right side): No submandibular adenopathy present.       Head (left side): No submandibular adenopathy present.    She has no cervical adenopathy.  Neurological: She is alert and oriented to person, place, and time. She has normal strength. No cranial nerve deficit or sensory deficit.  Grip is symmetrical.  There are no motor or sensory deficits of the right upper extremity.  No motor or sensory deficits of the lower extremities. Gait is intact.  Skin: Skin is warm and dry.  Psychiatric: She has a normal mood and affect. Her speech is normal.  Nursing note and vitals reviewed.    ED Treatments / Results  Labs (all labs ordered are listed, but only abnormal results are displayed) Labs Reviewed - No data to display  EKG  EKG Interpretation None       Radiology No results found.  Procedures Procedures (including critical care time)  Medications Ordered in ED Medications - No data to display   Initial Impression / Assessment and Plan / ED Course  I have reviewed the triage vital signs and the nursing notes.  Pertinent labs  & imaging results that were available during my care of the patient were reviewed by me and considered in my medical decision making (see chart for details).       Final Clinical Impressions(s) / ED Diagnoses MDM Blood pressure slightly elevated with diastolic pressures of 95 and 92.  Have asked the patient to have her blood pressure rechecked this week.  No gross neurovascular deficits appreciated of the upper or lower extremity.  Examination favors a torticollis on the right.  There are no motor or sensory deficits of the upper or lower extremities.  Doubt cerebrovascular accident.  The patient is asked  to use a heating pad to the area.  Patient is not dropping objects, doubt spinal cord compression.  Patient is asked to use a heating pad to the neck and shoulder area.  Patient is also asked to rest the neck and shoulder area as much as possible.  Patient will   Use Flexeril, Motrin, and Norco for assistance with pain.  Patient will follow up with orthopedics if not improving.  Patient will return to the emergency department for additional evaluation and management if any emergent changes or concerns.   Final diagnoses:  Torticollis    ED Discharge Orders        Ordered    cyclobenzaprine (FLEXERIL) 10 MG tablet  3 times daily     01/16/17 2030    ibuprofen (ADVIL,MOTRIN) 600 MG tablet  4 times daily     01/16/17 2030    HYDROcodone-acetaminophen (NORCO/VICODIN) 5-325 MG tablet  Every 4 hours PRN     01/16/17 2030    That the ODD   Ivery QualeBryant, Tregan Read, PA-C 01/17/17 [id number]    Doug SouJacubowitz, Sam, MD 01/17/17 [id number]

## 2017-01-16 NOTE — Discharge Instructions (Signed)
Your examination is suggestive of multiple muscle spasms in your neck and shoulder area called torticollis.  Please use a heating pad to the area.  Please rest your neck and shoulder as much as possible.  Use Flexeril 3 times daily and ibuprofen 4 times daily.  Please use Norco for more severe pain.  Norco and Flexeril may cause drowsiness.  Please do not drive, drink alcohol, operate machinery, or participate in activities requiring concentration when taking this medication.  Please see Dr.Trivedi this week for follow-up and recheck.

## 2017-01-16 NOTE — ED Triage Notes (Signed)
Pt reports waking up about a week ago and feeling like she had a crick in her neck (right side) pain is still present and not getting any better. Motrin, aleve, icy hot with no relief. No other symptoms reported.

## 2017-05-01 ENCOUNTER — Emergency Department (HOSPITAL_COMMUNITY): Payer: Medicaid - Out of State

## 2017-05-01 ENCOUNTER — Other Ambulatory Visit: Payer: Self-pay

## 2017-05-01 ENCOUNTER — Encounter (HOSPITAL_COMMUNITY): Payer: Self-pay | Admitting: Emergency Medicine

## 2017-05-01 ENCOUNTER — Emergency Department (HOSPITAL_COMMUNITY)
Admission: EM | Admit: 2017-05-01 | Discharge: 2017-05-01 | Disposition: A | Discharge status: Home / Self Care | Payer: Medicaid - Out of State | Attending: Emergency Medicine | Admitting: Emergency Medicine

## 2017-05-01 DIAGNOSIS — M62830 Muscle spasm of back: Secondary | ICD-10-CM | POA: Insufficient documentation

## 2017-05-01 DIAGNOSIS — Z79899 Other long term (current) drug therapy: Secondary | ICD-10-CM | POA: Insufficient documentation

## 2017-05-01 DIAGNOSIS — F17210 Nicotine dependence, cigarettes, uncomplicated: Secondary | ICD-10-CM | POA: Insufficient documentation

## 2017-05-01 DIAGNOSIS — M549 Dorsalgia, unspecified: Secondary | ICD-10-CM | POA: Diagnosis present

## 2017-05-01 LAB — URINALYSIS, ROUTINE W REFLEX MICROSCOPIC
BILIRUBIN URINE: NEGATIVE
Glucose, UA: NEGATIVE mg/dL
KETONES UR: NEGATIVE mg/dL
Leukocytes, UA: NEGATIVE
Nitrite: NEGATIVE
PH: 5 (ref 5.0–8.0)
Protein, ur: 30 mg/dL — AB
Specific Gravity, Urine: 1.021 (ref 1.005–1.030)

## 2017-05-01 LAB — POC URINE PREG, ED: PREG TEST UR: NEGATIVE

## 2017-05-01 MED ORDER — IBUPROFEN 800 MG PO TABS
800.0000 mg | ORAL_TABLET | Freq: Once | ORAL | Status: AC
  Administered 2017-05-01: 800 mg via ORAL
  Filled 2017-05-01: qty 1

## 2017-05-01 MED ORDER — DIAZEPAM 5 MG PO TABS
10.0000 mg | ORAL_TABLET | Freq: Once | ORAL | Status: AC
Start: 2017-05-01 — End: 2017-05-01
  Administered 2017-05-01: 10 mg via ORAL
  Filled 2017-05-01: qty 2

## 2017-05-01 MED ORDER — IBUPROFEN 600 MG PO TABS: 600.0000 mg | ORAL_TABLET | Freq: Four times a day (QID) | ORAL | 0 refills | Status: DC

## 2017-05-01 MED ORDER — DEXAMETHASONE 4 MG PO TABS: 4.0000 mg | ORAL_TABLET | Freq: Two times a day (BID) | ORAL | 0 refills | Status: DC

## 2017-05-01 MED ORDER — ONDANSETRON HCL 4 MG PO TABS
4.0000 mg | ORAL_TABLET | Freq: Once | ORAL | Status: AC
  Administered 2017-05-01: 4 mg via ORAL
  Filled 2017-05-01: qty 1

## 2017-05-01 MED ORDER — CYCLOBENZAPRINE HCL 10 MG PO TABS: 10.0000 mg | ORAL_TABLET | Freq: Three times a day (TID) | ORAL | 0 refills | Status: DC

## 2017-05-01 NOTE — ED Triage Notes (Signed)
Patient has lower back pain that started around 2 weeks ago, has try ice, heat pad, motrin and muscle relaxers without any relief.

## 2017-05-01 NOTE — ED Provider Notes (Signed)
[facility] EMERGENCY DEPARTMENT Provider Note   CSN: [id number] Arrival date & time: 05/01/17  2030     History   Chief Complaint Chief Complaint  Patient presents with  . Back Pain    HPI Lori Monroe is a 42 y.o. female.  Patient is a 42 year old female who presents to the emergency department with complaint of lower back pain.  This problem started approximately 2 weeks ago.  The patient complains of pain that is sometimes sharp and other times cramping type sensation of the lower back.  The patient states that during this 2-week.  Up until now she has had one episode in which she lost control of her bladder.  She is unsure of exactly why.  The patient also states that she has been having some temperature elevations the maximum was 102.  2 days ago she had a temperature of 101.  She says she does not think that she has had upper respiratory symptoms.  She has not had any blood in her urine and has not had any injury to the kidney area.  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  She does not recall any heavy lifting, excessive standing or twisting.  She has not had any recent operations or procedures involving her lower back.  She has not loss sensation in her private area or in this saddle area.  She presents to the emergency department now for evaluation of her lower back pain.      Past Medical History:  Diagnosis Date  . Meningitis   . Migraines   . Ovarian cyst     There are no active problems to display for this patient.   Past Surgical History:  Procedure Laterality Date  . ABLATION    . TUBAL LIGATION      OB History    Gravida Para Term Preterm AB Living             3   SAB TAB Ectopic Multiple Live Births                   Home Medications    Prior to Admission medications   Medication Sig Start Date End Date Taking? Authorizing Provider  Chlorhexidine Gluconate 4 % SOLN Apply 5 mLs topically daily.  07/12/16   Jacalyn Lefevre, MD  CRANBERRY PO Take 1 tablet by mouth daily.    [provider]  cyclobenzaprine (FLEXERIL) 10 MG tablet Take 1 tablet (10 mg total) by mouth 3 (three) times daily. 01/16/17   Ivery Quale, PA-C  docusate sodium (COLACE) 100 MG capsule Take 1 capsule (100 mg total) by mouth every 12 (twelve) hours. 07/12/16   Jacalyn Lefevre, MD  HYDROcodone-acetaminophen (NORCO/VICODIN) 5-325 MG tablet Take 1 tablet by mouth every 4 (four) hours as needed. 01/16/17   Ivery Quale, PA-C  hydrocortisone (ANUSOL-HC) 25 MG suppository Place 1 suppository (25 mg total) rectally 2 (two) times daily. 07/12/16   Jacalyn Lefevre, MD  ibuprofen (ADVIL,MOTRIN) 600 MG tablet Take 1 tablet (600 mg total) by mouth 4 (four) times daily. 01/16/17   Ivery Quale, PA-C  metoCLOPramide (REGLAN) 10 MG tablet Take 1 tablet (10 mg total) by mouth every 6 (six) hours as needed for nausea or vomiting. 03/19/16   Loren Racer, MD  ondansetron (ZOFRAN ODT) 4 MG disintegrating tablet Take 1 tablet (4 mg total) by mouth every 8 (eight) hours as needed. 07/12/16   Jacalyn Lefevre,  MD  PROAIR HFA 108 (90 Base) MCG/ACT inhaler Inhale 1-2 puffs into the lungs every 6 (six) hours as needed for wheezing or shortness of breath.  01/27/16   [provider]  promethazine (PHENERGAN) 25 MG suppository Place 1 suppository (25 mg total) rectally every 6 (six) hours as needed for nausea or vomiting. 04/06/16   Ivery Quale, PA-C    Family History History reviewed. No pertinent family history.  Social History Social History   Tobacco Use  . Smoking status: Current Every Day Smoker    Packs/day: 0.50    Types: Cigarettes  . Smokeless tobacco: Never Used  Substance Use Topics  . Alcohol use: No  . Drug use: No     Allergies   Tramadol   Review of Systems Review of Systems  Constitutional: Positive for chills and fever. Negative for activity change.       All ROS Neg except as noted in HPI    HENT: Negative for nosebleeds.   Eyes: Negative for photophobia and discharge.  Respiratory: Negative for cough, shortness of breath and wheezing.   Cardiovascular: Negative for chest pain and palpitations.  Gastrointestinal: Negative for abdominal pain and blood in stool.  Genitourinary: Negative for dysuria, frequency and hematuria.  Musculoskeletal: Positive for back pain. Negative for arthralgias and neck pain.  Skin: Negative.   Neurological: Negative for dizziness, seizures and speech difficulty.  Psychiatric/Behavioral: Negative for confusion and hallucinations.     Physical Exam Updated Vital Signs BP 110/90 (BP Location: Left Arm)   Pulse (!) 108   Temp 98.9 F (37.2 C) (Oral)   Resp 18   Ht 5\' 6"  (1.676 m)   Wt 83 kg (183 lb)   SpO2 100%   BMI 29.54 kg/m   Physical Exam  Constitutional: She is oriented to person, place, and time. She appears well-developed and well-nourished.  Non-toxic appearance.  HENT:  Head: Normocephalic.  Right Ear: Tympanic membrane and external ear normal.  Left Ear: Tympanic membrane and external ear normal.  Eyes: EOM and lids are normal. Pupils are equal, round, and reactive to light.  Neck: Normal range of motion. Neck supple. Carotid bruit is not present.  Cardiovascular: Normal rate, regular rhythm, normal heart sounds, intact distal pulses and normal pulses.  Pulmonary/Chest: Breath sounds normal. No respiratory distress.  Abdominal: Soft. Bowel sounds are normal. There is no tenderness. There is no guarding.  Musculoskeletal: Normal range of motion.       Lumbar back: She exhibits pain and spasm.       Back:  Lymphadenopathy:       Head (right side): No submandibular adenopathy present.       Head (left side): No submandibular adenopathy present.    She has no cervical adenopathy.  Neurological: She is alert and oriented to person, place, and time. She has normal strength. No cranial nerve deficit or sensory deficit.  Skin:  Skin is warm and dry.  Psychiatric: She has a normal mood and affect. Her speech is normal.  Nursing note and vitals reviewed.    ED Treatments / Results  Labs (all labs ordered are listed, but only abnormal results are displayed) Labs Reviewed  URINALYSIS, ROUTINE W REFLEX MICROSCOPIC  POC URINE PREG, ED    EKG  EKG Interpretation None       Radiology No results found.  Procedures Procedures (including critical care time)  Medications Ordered in ED Medications - No data to display   Initial Impression / Assessment and  Plan / ED Course  I have reviewed the triage vital signs and the nursing notes.  Pertinent labs & imaging results that were available during my care of the patient were reviewed by me and considered in my medical decision making (see chart for details).       Final Clinical Impressions(s) / ED Diagnoses MDM Vital signs within normal limits.  Pulse oximetry is 100% on room air.  Within normal limits by my interpretation.  Patient having a problem with lower back pain.  There is no evidence of cauda equina on examination.  No other emergent changes on exam noted.  Pregnancy test is negative.  Doubt this is related to an ectopic pregnancy or pregnancy in general.  Urine analysis shows a hazy yellow specimen with a specific gravity 1.021.  There is a moderate amount of blood on the dipstick, but 0-5 red cells per high-powered field and 0-5 white blood cells per high-powered field.  Nitrates and leukocyte esterase are all negative.  Doubt kidney stone or urinary tract infection.  X-ray of the lumbar spine shows no evidence of fracture.  No dislocation.  The intervertebral disc spaces are well maintained.  I suspect the patient has muscle strain with muscle spasm present.  The patient will be treated with Flexeril, Decadron, and ibuprofen.  I have asked patient to use a heating pad.  I have asked the patient to see orthopedics for additional evaluation if not  improving.  Patient is in agreement with this plan.   Final diagnoses:  Muscle spasm of back    ED Discharge Orders        Ordered    cyclobenzaprine (FLEXERIL) 10 MG tablet  3 times daily     05/01/17 2346    dexamethasone (DECADRON) 4 MG tablet  2 times daily with meals     05/01/17 2346    ibuprofen (ADVIL,MOTRIN) 600 MG tablet  4 times daily     05/01/17 2346       Ivery QualeBryant, Gussie Murton, PA-C 05/03/17 0117    Mesner, Barbara CowerJason, MD 05/04/17 [phone]

## 2017-05-01 NOTE — Discharge Instructions (Signed)
Your urine test is negative for urinary tract infection or evidence of kidney stone.  Your x-ray is negative for any silent fracture, dislocation, or changes in the disc spaces.  Please use a heating pad to your back.  I suspect that you may have a muscle spasm problem of your lower back.  Please rest her back is much as possible.  Use Flexeril 3 times daily when possible.  Use ibuprofen with breakfast, lunch, dinner, and at bedtime.  Use Decadron 2 times daily with food.  Please see Dr. Romeo AppleHarrison for orthopedic evaluation of your back pain if this problem continues.

## 2017-11-06 ENCOUNTER — Encounter (HOSPITAL_COMMUNITY): Payer: Self-pay | Admitting: Emergency Medicine

## 2017-11-06 ENCOUNTER — Emergency Department (HOSPITAL_COMMUNITY): Payer: Medicaid - Out of State

## 2017-11-06 ENCOUNTER — Other Ambulatory Visit: Payer: Self-pay

## 2017-11-06 ENCOUNTER — Emergency Department (HOSPITAL_COMMUNITY)
Admission: EM | Admit: 2017-11-06 | Discharge: 2017-11-06 | Disposition: A | Discharge status: Home / Self Care | Payer: Medicaid - Out of State | Attending: Emergency Medicine | Admitting: Emergency Medicine

## 2017-11-06 DIAGNOSIS — K2100 Gastro-esophageal reflux disease with esophagitis, without bleeding: Secondary | ICD-10-CM

## 2017-11-06 DIAGNOSIS — F17210 Nicotine dependence, cigarettes, uncomplicated: Secondary | ICD-10-CM | POA: Diagnosis not present

## 2017-11-06 DIAGNOSIS — R079 Chest pain, unspecified: Secondary | ICD-10-CM | POA: Diagnosis present

## 2017-11-06 LAB — BASIC METABOLIC PANEL
Anion gap: 11 (ref 5–15)
BUN: 8 mg/dL (ref 6–20)
CALCIUM: 9.6 mg/dL (ref 8.9–10.3)
CO2: 26 mmol/L (ref 22–32)
CREATININE: 0.72 mg/dL (ref 0.44–1.00)
Chloride: 104 mmol/L (ref 98–111)
GFR calc non Af Amer: >60 mL/min (ref >60)
GLUCOSE: 139 mg/dL — AB (ref 70–99)
Potassium: 3 mmol/L — ABNORMAL LOW (ref 3.5–5.1)
Sodium: 141 mmol/L (ref 135–145)

## 2017-11-06 LAB — CBC
HCT: 41.9 % (ref 36.0–46.0)
Hemoglobin: 13.8 g/dL (ref 12.0–15.0)
MCH: 28.3 pg (ref 26.0–34.0)
MCHC: 32.9 g/dL (ref 30.0–36.0)
MCV: 85.9 fL (ref 78.0–100.0)
PLATELETS: 367 10*3/uL (ref 150–400)
RBC: 4.88 MIL/uL (ref 3.87–5.11)
RDW: 14.3 % (ref 11.5–15.5)
WBC: 12.8 10*3/uL — ABNORMAL HIGH (ref 4.0–10.5)

## 2017-11-06 LAB — TROPONIN I

## 2017-11-06 MED ORDER — POTASSIUM CHLORIDE CRYS ER 20 MEQ PO TBCR
40.0000 meq | EXTENDED_RELEASE_TABLET | Freq: Once | ORAL | Status: AC
  Administered 2017-11-06: 40 meq via ORAL
  Filled 2017-11-06: qty 2

## 2017-11-06 MED ORDER — HYDROMORPHONE HCL 1 MG/ML IJ SOLN
1.0000 mg | Freq: Once | INTRAMUSCULAR | Status: AC
  Administered 2017-11-06: 1 mg via INTRAMUSCULAR
  Filled 2017-11-06: qty 1

## 2017-11-06 MED ORDER — HYDROCODONE-ACETAMINOPHEN 5-325 MG PO TABS: 1.0000 | ORAL_TABLET | Freq: Four times a day (QID) | ORAL | 0 refills | Status: AC | PRN

## 2017-11-06 MED ORDER — ONDANSETRON 4 MG PO TBDP
4.0000 mg | ORAL_TABLET | Freq: Once | ORAL | Status: AC
  Administered 2017-11-06: 4 mg via ORAL
  Filled 2017-11-06: qty 1

## 2017-11-06 MED ORDER — PANTOPRAZOLE SODIUM 40 MG PO TBEC
40.0000 mg | DELAYED_RELEASE_TABLET | Freq: Once | ORAL | Status: AC
  Administered 2017-11-06: 40 mg via ORAL
  Filled 2017-11-06: qty 1

## 2017-11-06 MED ORDER — GI COCKTAIL ~~LOC~~
30.0000 mL | Freq: Once | ORAL | Status: AC
  Administered 2017-11-06: 30 mL via ORAL
  Filled 2017-11-06: qty 30

## 2017-11-06 MED ORDER — PANTOPRAZOLE SODIUM 20 MG PO TBEC: 20.0000 mg | DELAYED_RELEASE_TABLET | Freq: Every day | ORAL | 0 refills | Status: AC

## 2017-11-06 NOTE — Discharge Instructions (Addendum)
Follow-up with your family doctor later this week or next week for recheck

## 2017-11-06 NOTE — ED Triage Notes (Signed)
Pt reports chest pain for a few days now worsening this morning before work.

## 2017-11-06 NOTE — ED Notes (Signed)
Pt states that the pain had went away but now it's coming back.

## 2017-11-07 NOTE — ED Provider Notes (Signed)
[facility] EMERGENCY DEPARTMENT Provider Note   CSN: [id number] Arrival date & time: 11/06/17  1850     History   Chief Complaint Chief Complaint  Patient presents with  . Chest Pain    HPI Lori Monroe is a 42 y.o. female.  Patient comes in with burning in her chest and epigastric discomfort.  The history is provided by the patient. No language interpreter was used.  Chest Pain   This is a new problem. The current episode started yesterday. The problem occurs constantly. The problem has not changed since onset.Associated with: Eating. The pain is present in the substernal region. The pain is at a severity of 4/10. The pain is moderate. The quality of the pain is described as burning. The pain does not radiate. Associated symptoms include abdominal pain. Pertinent negatives include no back pain, no cough and no headaches.  Pertinent negatives for past medical history include no seizures.    Past Medical History:  Diagnosis Date  . Meningitis   . Migraines   . Ovarian cyst     There are no active problems to display for this patient.   Past Surgical History:  Procedure Laterality Date  . ABLATION    . TUBAL LIGATION       OB History    Gravida      Para      Term      Preterm      AB      Living  3     SAB      TAB      Ectopic      Multiple      Live Births               Home Medications    Prior to Admission medications   Medication Sig Start Date End Date Taking? Authorizing Provider  acetaminophen (TYLENOL) 500 MG tablet Take 500-1,000 mg by mouth daily as needed for mild pain or moderate pain.   Yes [provider]  butalbital-aspirin-caffeine Benny Lennert(FIORINAL) 50-325-40 MG capsule Take 1-2 capsules by mouth daily as needed. 10/31/17  Yes [provider]  PROAIR HFA 108 (90 Base) MCG/ACT inhaler Inhale 1-2 puffs into the lungs every 6 (six) hours as needed for wheezing or shortness of breath.  01/27/16  Yes [provider]  HYDROcodone-acetaminophen (NORCO/VICODIN) 5-325 MG tablet Take 1 tablet by mouth every 6 (six) hours as needed for moderate pain. 11/06/17   Bethann BerkshireZammit, Joanmarie Tsang, MD  pantoprazole (PROTONIX) 20 MG tablet Take 1 tablet (20 mg total) by mouth daily. 11/06/17   Bethann BerkshireZammit, Stacey Maura, MD    Family History History reviewed. No pertinent family history.  Social History Social History   Tobacco Use  . Smoking status: Current Every Day Smoker    Packs/day: 0.50    Types: Cigarettes  . Smokeless tobacco: Never Used  Substance Use Topics  . Alcohol use: No  . Drug use: No     Allergies   Tramadol   Review of Systems Review of Systems  Constitutional: Negative for appetite change and fatigue.  HENT: Negative for congestion, ear discharge and sinus pressure.   Eyes: Negative for discharge.  Respiratory: Negative for cough.   Cardiovascular: Positive for chest pain.  Gastrointestinal: Positive for abdominal pain. Negative for diarrhea.  Genitourinary: Negative for frequency and hematuria.  Musculoskeletal: Negative for back pain.  Skin: Negative for rash.  Neurological: Negative for seizures and headaches.  Psychiatric/Behavioral: Negative for hallucinations.  Physical Exam Updated Vital Signs BP 121/71 Comment: Simultaneous filing. User may not have seen previous data.  Pulse 86 Comment: Simultaneous filing. User may not have seen previous data.  Temp 98.6 F (37 C) (Oral)   Resp (!) 22 Comment: Simultaneous filing. User may not have seen previous data.  Ht 5\' 4"  (1.626 m)   Wt 79.4 kg   SpO2 96% Comment: Simultaneous filing. User may not have seen previous data.  BMI 30.04 kg/m   Physical Exam  Constitutional: She is oriented to person, place, and time. She appears well-developed.  HENT:  Head: Normocephalic.  Eyes: Conjunctivae and EOM are normal. No scleral icterus.  Neck: Neck supple. No thyromegaly present.  Cardiovascular: Normal rate and regular rhythm.  Exam reveals no gallop and no friction rub.  No murmur heard. Pulmonary/Chest: No stridor. She has no wheezes. She has no rales. She exhibits no tenderness.  Abdominal: She exhibits no distension. There is tenderness. There is no rebound.  Musculoskeletal: Normal range of motion. She exhibits no edema.  Lymphadenopathy:    She has no cervical adenopathy.  Neurological: She is oriented to person, place, and time. She exhibits normal muscle tone. Coordination normal.  Skin: No rash noted. No erythema.  Psychiatric: She has a normal mood and affect. Her behavior is normal.     ED Treatments / Results  Labs (all labs ordered are listed, but only abnormal results are displayed) Labs Reviewed  BASIC METABOLIC PANEL - Abnormal; Notable for the following components:      Result Value   Potassium 3.0 (*)    Glucose, Bld 139 (*)    All other components within normal limits  CBC - Abnormal; Notable for the following components:   WBC 12.8 (*)    All other components within normal limits  TROPONIN I    EKG EKG Interpretation  Date/Time:  Monday November 06 2017 18:53:19 EDT Ventricular Rate:  86 PR Interval:  120 QRS Duration: 70 QT Interval:  346 QTC Calculation: 414 R Axis:   70 Text Interpretation:  Normal sinus rhythm Nonspecific T wave abnormality Abnormal ECG Confirmed by Bethann Berkshire [phone]) on 11/06/2017 9:23:59 PM Also confirmed by Bethann Berkshire [phone])  on 11/06/2017 11:50:29 PM   Radiology Dg Chest 2 View  Result Date: 11/06/2017 CLINICAL DATA:  Chest pain EXAM: CHEST - 2 VIEW COMPARISON:  None. FINDINGS: Normal heart size. Normal mediastinal contour. No pneumothorax. No pleural effusion. Lungs appear clear, with no acute consolidative airspace disease and no pulmonary edema. IMPRESSION: No active cardiopulmonary disease. Electronically Signed   By: Delbert Phenix M.D.   On: 11/06/2017 19:52    Procedures Procedures (including critical care time)  Medications Ordered  in ED Medications  gi cocktail (Maalox,Lidocaine,Donnatal) (30 mLs Oral Given 11/06/17 2140)  ondansetron (ZOFRAN-ODT) disintegrating tablet 4 mg (4 mg Oral Given 11/06/17 2137)  HYDROmorphone (DILAUDID) injection 1 mg (1 mg Intramuscular Given 11/06/17 2138)  potassium chloride SA (K-DUR,KLOR-CON) CR tablet 40 mEq (40 mEq Oral Given 11/06/17 2357)  pantoprazole (PROTONIX) EC tablet 40 mg (40 mg Oral Given 11/06/17 2357)     Initial Impression / Assessment and Plan / ED Course  I have reviewed the triage vital signs and the nursing notes.  Pertinent labs & imaging results that were available during my care of the patient were reviewed by me and considered in my medical decision making (see chart for details).     Patient with epigastric discomfort.  Patient symptoms improved with GI cocktail.  Troponin negative EKG shows no acute changes.  Doubt this is coronary artery disease.  Patient will be placed on Protonix and hydrocodone and will follow-up with PCP  Final Clinical Impressions(s) / ED Diagnoses   Final diagnoses:  Gastroesophageal reflux disease with esophagitis    ED Discharge Orders         Ordered    pantoprazole (PROTONIX) 20 MG tablet  Daily     11/06/17 2352    HYDROcodone-acetaminophen (NORCO/VICODIN) 5-325 MG tablet  Every 6 hours PRN     11/06/17 2352           Bethann Berkshire, MD 11/07/17 1644

## 2019-04-28 IMAGING — DX DG LUMBAR SPINE COMPLETE 4+V
5 series · 5 of 5 positions shown · non-contrast
Comparison: CT 07/12/2016

CLINICAL DATA: Low back pain

EXAM:
LUMBAR SPINE - COMPLETE 4+ VIEW

[l-spine ap]
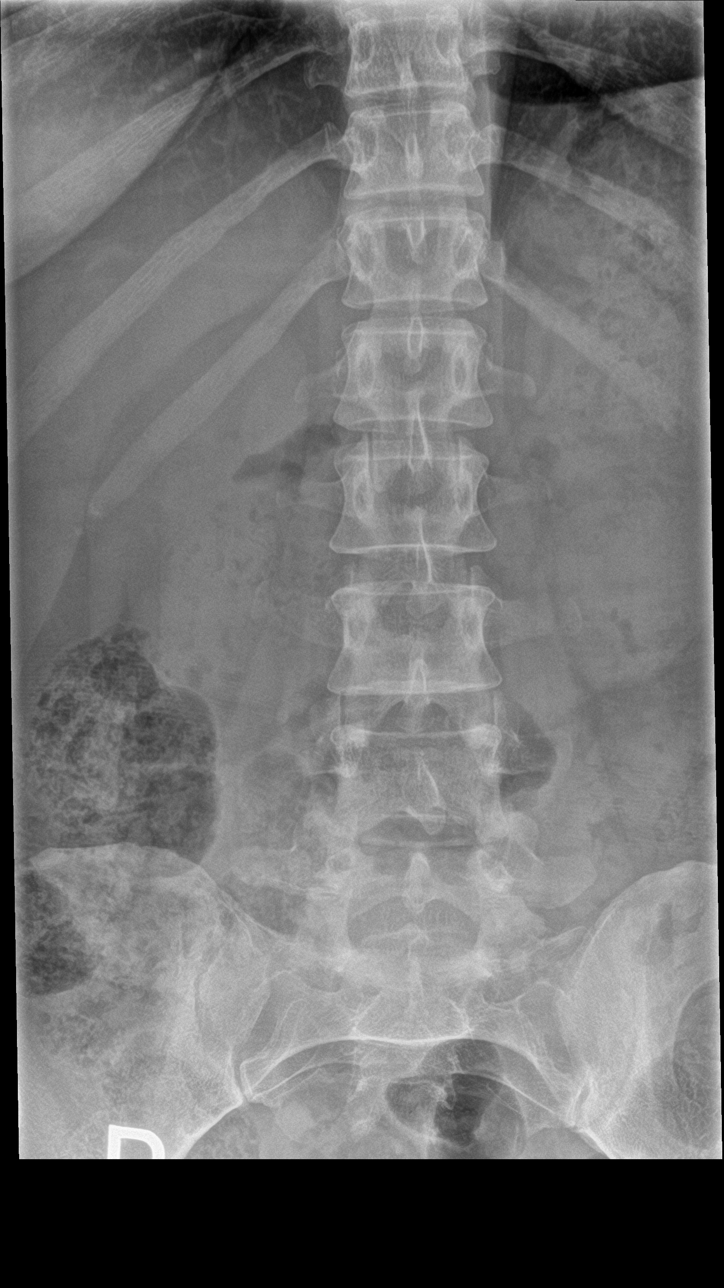

[l-spine obl (1 of 2)]
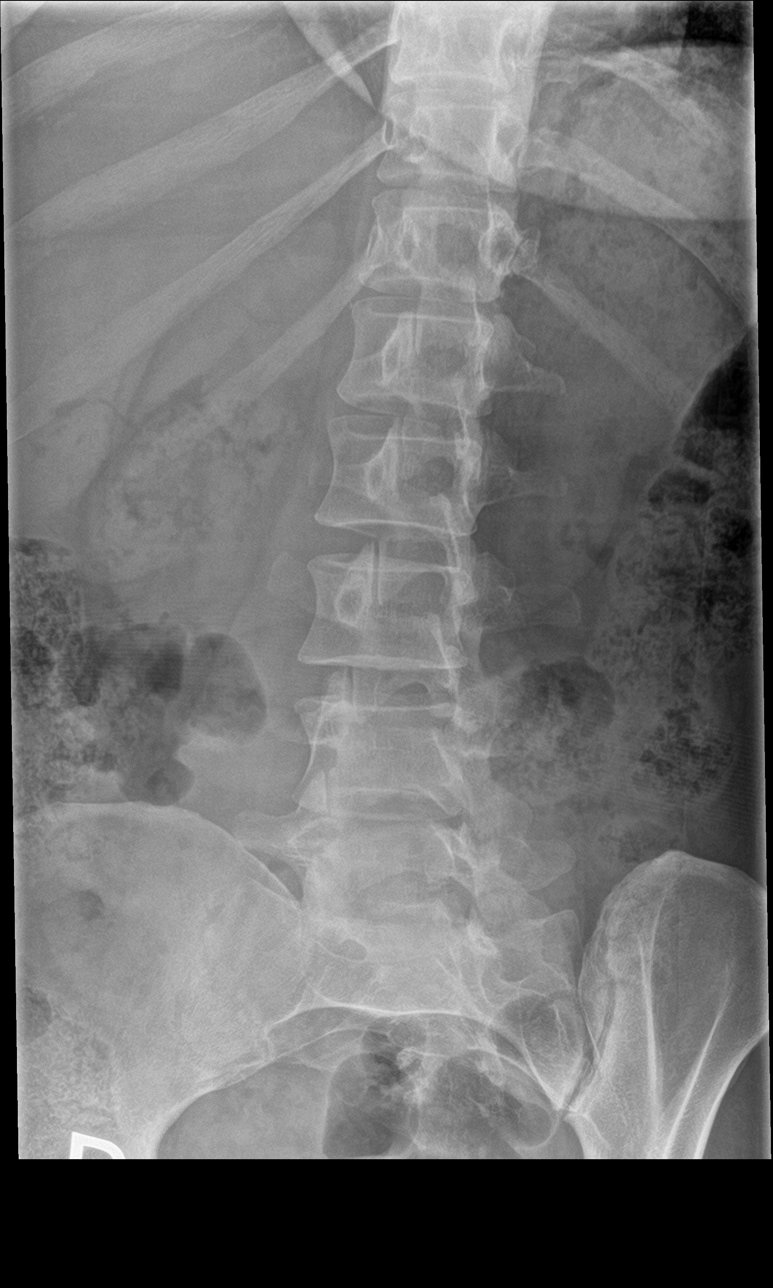

[l-spine obl (2 of 2)]
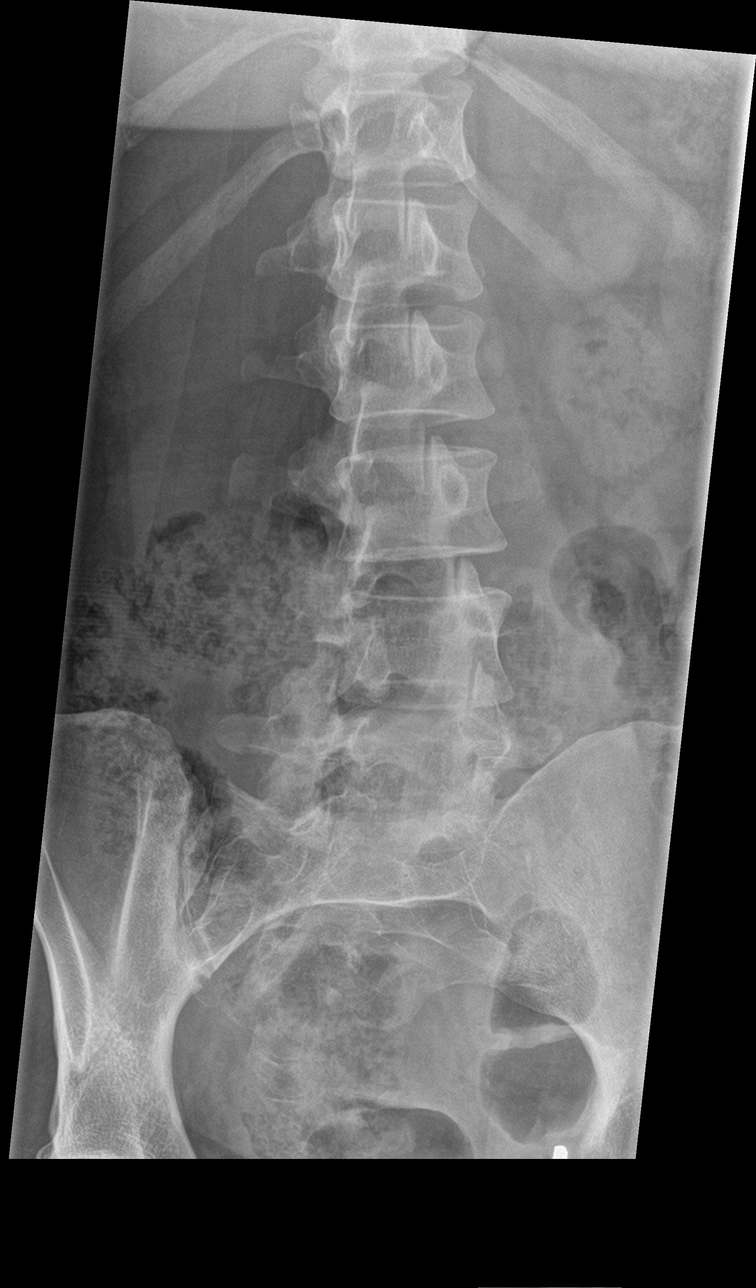

[l-spine lat]
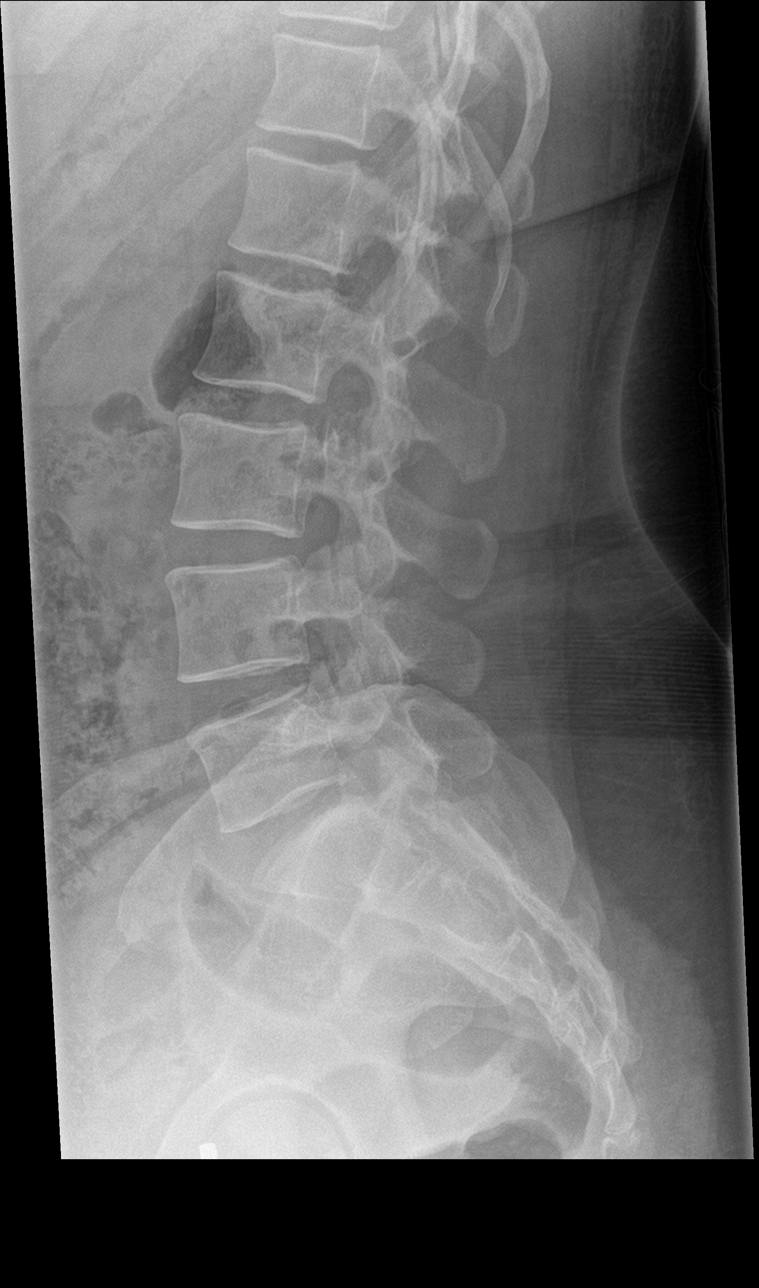

[l-spine spot]
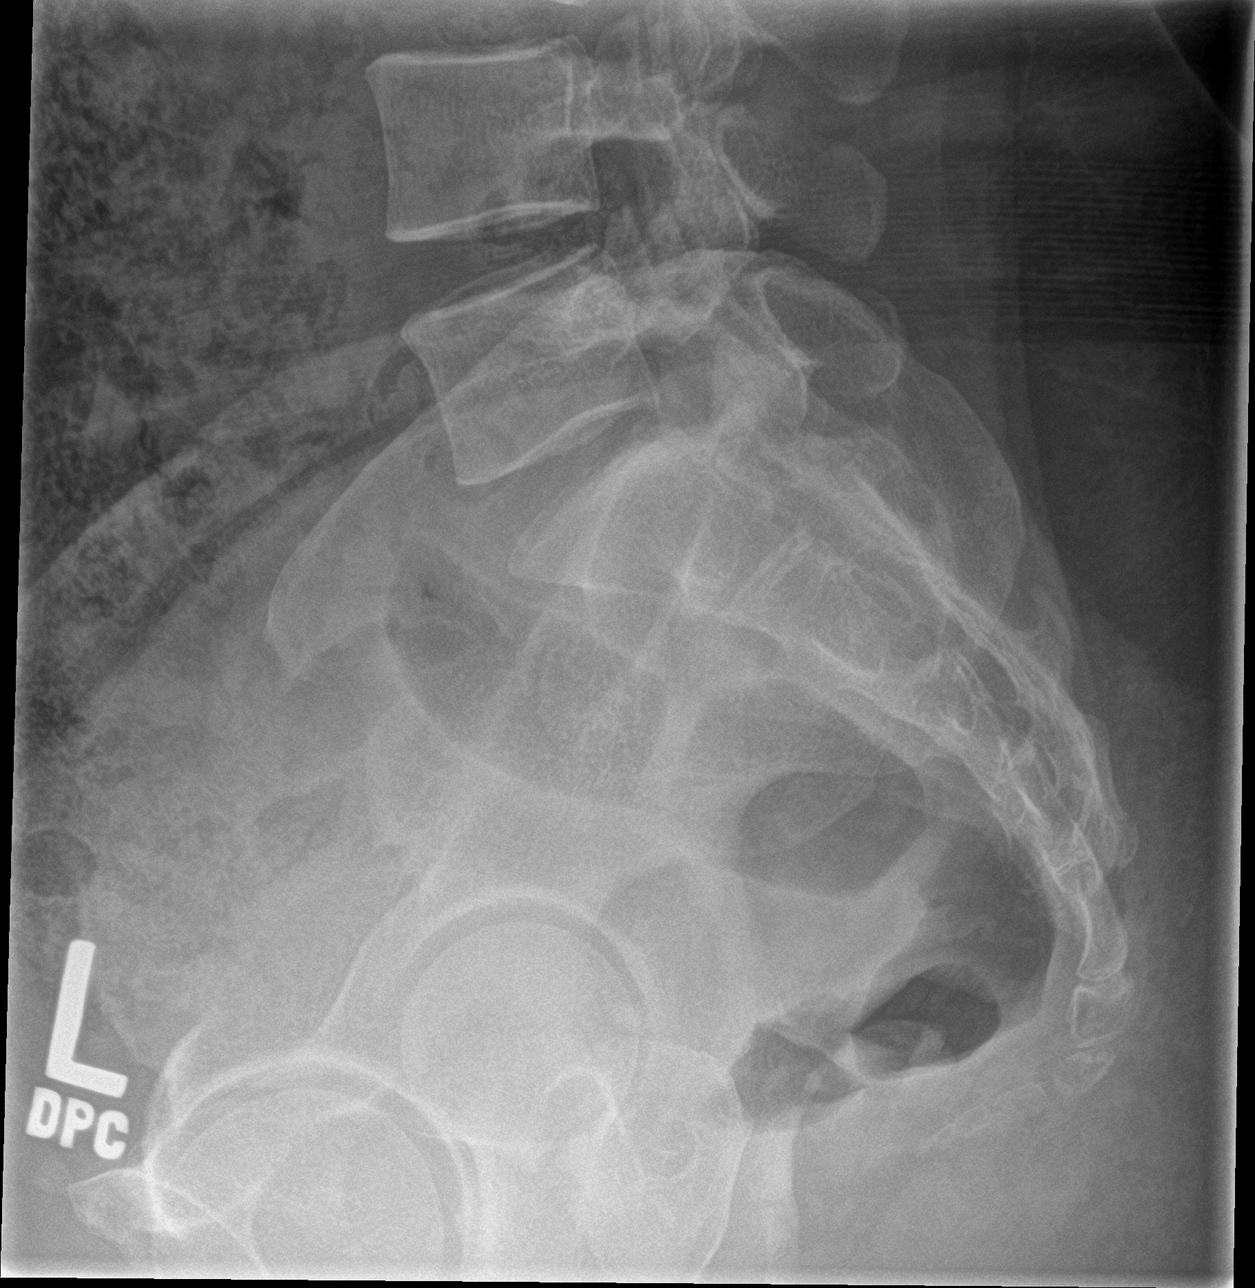

[5 of 5 positions shown; findings below may reference images not displayed]

FINDINGS: There is no evidence of lumbar spine fracture. Alignment is normal.
Intervertebral disc spaces are maintained.
IMPRESSION: Negative.
